# Patient Record
Sex: Female | Born: 1975 | Race: White | Hispanic: No | Marital: Married | Smoking: Never smoker
Health system: Southern US, Community
[De-identification: ages and names within clinical notes are randomized; demographics above are authoritative.]

## PROBLEM LIST (undated history)

## (undated) DIAGNOSIS — R112 Nausea with vomiting, unspecified: Secondary | ICD-10-CM

## (undated) DIAGNOSIS — R519 Headache, unspecified: Secondary | ICD-10-CM

## (undated) DIAGNOSIS — F439 Reaction to severe stress, unspecified: Secondary | ICD-10-CM

## (undated) DIAGNOSIS — R51 Headache: Secondary | ICD-10-CM

## (undated) DIAGNOSIS — Z9889 Other specified postprocedural states: Secondary | ICD-10-CM

## (undated) DIAGNOSIS — I341 Nonrheumatic mitral (valve) prolapse: Secondary | ICD-10-CM

## (undated) DIAGNOSIS — J386 Stenosis of larynx: Secondary | ICD-10-CM

## (undated) DIAGNOSIS — R195 Other fecal abnormalities: Secondary | ICD-10-CM

## (undated) DIAGNOSIS — N939 Abnormal uterine and vaginal bleeding, unspecified: Principal | ICD-10-CM

## (undated) HISTORY — PX: DILATION AND CURETTAGE OF UTERUS: SHX78

## (undated) HISTORY — PX: REDUCTION MAMMAPLASTY: SUR839

## (undated) HISTORY — DX: Reaction to severe stress, unspecified: F43.9

## (undated) HISTORY — DX: Other fecal abnormalities: R19.5

## (undated) HISTORY — DX: Abnormal uterine and vaginal bleeding, unspecified: N93.9

## (undated) HISTORY — PX: WISDOM TOOTH EXTRACTION: SHX21

---

## 2006-02-12 ENCOUNTER — Emergency Department (HOSPITAL_COMMUNITY): Admission: EM | Admit: 2006-02-12 | Discharge: 2006-02-12 | Payer: Self-pay | Admitting: Emergency Medicine

## 2006-02-15 ENCOUNTER — Encounter (INDEPENDENT_AMBULATORY_CARE_PROVIDER_SITE_OTHER): Payer: Self-pay | Admitting: Specialist

## 2006-02-15 ENCOUNTER — Ambulatory Visit (HOSPITAL_COMMUNITY): Admission: RE | Admit: 2006-02-15 | Discharge: 2006-02-15 | Payer: Self-pay | Admitting: General Surgery

## 2006-02-15 HISTORY — PX: CHOLECYSTECTOMY: SHX55

## 2007-07-18 ENCOUNTER — Other Ambulatory Visit: Admission: RE | Admit: 2007-07-18 | Discharge: 2007-07-18 | Payer: Self-pay | Admitting: Obstetrics and Gynecology

## 2008-08-16 ENCOUNTER — Other Ambulatory Visit: Admission: RE | Admit: 2008-08-16 | Discharge: 2008-08-16 | Payer: Self-pay | Admitting: Obstetrics and Gynecology

## 2008-11-13 ENCOUNTER — Ambulatory Visit (HOSPITAL_COMMUNITY): Admission: RE | Admit: 2008-11-13 | Discharge: 2008-11-13 | Payer: Self-pay | Admitting: Family Medicine

## 2009-02-19 HISTORY — PX: PLANTAR FASCIA RELEASE: SHX2239

## 2009-02-27 ENCOUNTER — Ambulatory Visit (HOSPITAL_COMMUNITY): Admission: RE | Admit: 2009-02-27 | Discharge: 2009-02-27 | Payer: Self-pay | Admitting: Podiatry

## 2009-02-27 HISTORY — PX: FOOT SURGERY: SHX648

## 2009-04-11 ENCOUNTER — Ambulatory Visit (HOSPITAL_COMMUNITY): Admission: RE | Admit: 2009-04-11 | Discharge: 2009-04-11 | Payer: Self-pay | Admitting: Orthopedic Surgery

## 2009-05-01 ENCOUNTER — Ambulatory Visit (HOSPITAL_BASED_OUTPATIENT_CLINIC_OR_DEPARTMENT_OTHER): Admission: RE | Admit: 2009-05-01 | Discharge: 2009-05-01 | Payer: Self-pay | Admitting: Orthopedic Surgery

## 2009-05-01 HISTORY — PX: GASTROCNEMIUS RECESSION: SHX863

## 2009-05-28 ENCOUNTER — Encounter (HOSPITAL_COMMUNITY): Admission: RE | Admit: 2009-05-28 | Discharge: 2009-06-27 | Payer: Self-pay | Admitting: Orthopedic Surgery

## 2009-12-24 ENCOUNTER — Ambulatory Visit (HOSPITAL_COMMUNITY)
Admission: RE | Admit: 2009-12-24 | Discharge: 2009-12-24 | Payer: Self-pay | Source: Home / Self Care | Admitting: Family Medicine

## 2010-02-09 ENCOUNTER — Encounter: Payer: Self-pay | Admitting: Orthopedic Surgery

## 2010-04-09 LAB — BASIC METABOLIC PANEL
BUN: 12 mg/dL (ref 6–23)
Chloride: 103 mEq/L (ref 96–112)
Glucose, Bld: 96 mg/dL (ref 70–99)
Potassium: 3.6 mEq/L (ref 3.5–5.1)
Sodium: 139 mEq/L (ref 135–145)

## 2010-04-09 LAB — CBC
HCT: 38.8 % (ref 36.0–46.0)
Hemoglobin: 13 g/dL (ref 12.0–15.0)
MCV: 92.1 fL (ref 78.0–100.0)
Platelets: 216 10*3/uL (ref 150–400)
WBC: 8.4 10*3/uL (ref 4.0–10.5)

## 2010-06-06 NOTE — Op Note (Signed)
Laurie Thompson, Laurie Thompson                ACCOUNT NO.:  [id number]   MEDICAL RECORD NO.:  [id number]          PATIENT TYPE:  AMB   LOCATION:  DAY                           FACILITY:  [facility]   PHYSICIAN:  Dalia Heading, M.D.  DATE OF BIRTH:  [birth year]   DATE OF PROCEDURE:  02/15/2006  DATE OF DISCHARGE:                               OPERATIVE REPORT   PREOPERATIVE DIAGNOSIS:  Chronic cholecystitis.   POSTOPERATIVE DIAGNOSIS:  Chronic cholecystitis.   PROCEDURE:  Laparoscopic cholecystectomy.   SURGEON:  Dr. Franky Macho.   ANESTHESIA:  General endotracheal.   INDICATIONS:  The patient is a 35 year old white female who presents  with biliary colic secondary to chronic cholecystitis.  The risks and  benefits of the procedure including bleeding, infection, hepatobiliary  injury, and the possibility of an open procedure were fully explained to  the patient, who gave informed consent.   PROCEDURE NOTE:  The patient was placed in the supine position.  After  induction of general endotracheal anesthesia, the abdomen was prepped  and draped using the usual sterile technique with Betadine.  Surgical  site confirmation was performed.   An infraumbilical incision was made down to the fascia.  A Veress needle  was introduced into the abdominal cavity and confirmation of placement  was done using the saline drop test.  The abdomen was then insufflated  to 16 mmHg pressure.  An 11 mm trocar was introduced into the abdominal  cavity under direct visualization without difficulty.  The patient was  placed in reversed Trendelenburg position.  An additional 11-mm trocar  was placed in the epigastric region and 5-mm trocars were placed in the  right upper quadrant, right flank regions.  The liver was inspected and  noted to be within normal limits.  The gallbladder was retracted  superior and laterally.  The dissection was begun around the  infundibulum of the gallbladder.  The cystic duct was first  identified.  The infundibulum was fully identified.  Endo clips were placed  proximally distally on the cystic duct and the cystic duct was divided.  This was likewise done to the cystic artery.   The gallbladder was then freed away from the gallbladder fossa using  Bovie electrocautery.  The gallbladder was delivered through the  epigastric trocar site using an EndoCatch bag.  The gallbladder fossa  was inspected and no abnormal bleeding or bile leakage was noted.  Surgicel was placed in the gallbladder fossa.  All fluid and air were  then evacuated from the abdominal cavity prior to removal of the  trocars.   All wounds were irrigated with normal saline.  All wounds were injected  with 0.5 cm Sensorcaine.  The infraumbilical fascia was reapproximated  using an O Vicryl interrupted suture.  All skin incisions were closed  using staples.  Betadine ointment and dry sterile dressings were  applied.   All tape and needle counts were correct at the end of the procedure.  The patient was extubated in the operating room and went back to the  recovery room awake and in stable condition.  COMPLICATIONS:  None.   SPECIMEN:  Gallbladder.   ESTIMATED BLOOD LOSS:  Minimal.      Dalia Heading, M.D.  Electronically Signed     MAJ/MEDQ  D:  02/15/2006  T:  02/15/2006  Job:  [id number]   cc:   Dalia Heading, M.D.  Fax: [phone]   Donzetta Sprung  Fax: [phone]

## 2010-09-01 ENCOUNTER — Other Ambulatory Visit: Payer: Self-pay | Admitting: Adult Health

## 2010-09-01 ENCOUNTER — Other Ambulatory Visit (HOSPITAL_COMMUNITY)
Admission: RE | Admit: 2010-09-01 | Discharge: 2010-09-01 | Disposition: A | Discharge status: Home / Self Care | Payer: 59 | Source: Ambulatory Visit | Attending: Obstetrics and Gynecology | Admitting: Obstetrics and Gynecology

## 2010-09-01 DIAGNOSIS — Z01419 Encounter for gynecological examination (general) (routine) without abnormal findings: Secondary | ICD-10-CM | POA: Insufficient documentation

## 2010-09-19 ENCOUNTER — Ambulatory Visit (HOSPITAL_COMMUNITY)
Admission: RE | Admit: 2010-09-19 | Discharge: 2010-09-19 | Disposition: A | Discharge status: Home / Self Care | Payer: 59 | Source: Ambulatory Visit | Attending: Family Medicine | Admitting: Family Medicine

## 2010-09-19 ENCOUNTER — Other Ambulatory Visit: Payer: Self-pay | Admitting: Family Medicine

## 2010-09-19 DIAGNOSIS — M542 Cervicalgia: Secondary | ICD-10-CM

## 2011-02-19 ENCOUNTER — Other Ambulatory Visit: Payer: Self-pay | Admitting: Obstetrics & Gynecology

## 2011-02-27 ENCOUNTER — Encounter (HOSPITAL_COMMUNITY): Payer: Self-pay

## 2011-02-27 ENCOUNTER — Emergency Department (HOSPITAL_COMMUNITY)
Admission: EM | Admit: 2011-02-27 | Discharge: 2011-02-27 | Disposition: A | Discharge status: Home / Self Care | Payer: 59 | Source: Home / Self Care | Attending: Emergency Medicine | Admitting: Emergency Medicine

## 2011-02-27 DIAGNOSIS — H8110 Benign paroxysmal vertigo, unspecified ear: Secondary | ICD-10-CM

## 2011-02-27 HISTORY — DX: Headache: R51

## 2011-02-27 HISTORY — DX: Nonrheumatic mitral (valve) prolapse: I34.1

## 2011-02-27 HISTORY — DX: Headache, unspecified: R51.9

## 2011-02-27 LAB — POCT URINALYSIS DIP (DEVICE)
Bilirubin Urine: NEGATIVE
Ketones, ur: NEGATIVE mg/dL
Protein, ur: NEGATIVE mg/dL
Specific Gravity, Urine: 1.02 (ref 1.005–1.030)
pH: 7 (ref 5.0–8.0)

## 2011-02-27 LAB — POCT I-STAT, CHEM 8
BUN: 12 mg/dL (ref 6–23)
Chloride: 105 mEq/L (ref 96–112)
Creatinine, Ser: 0.7 mg/dL (ref 0.50–1.10)
Glucose, Bld: 96 mg/dL (ref 70–99)
Hemoglobin: 13.9 g/dL (ref 12.0–15.0)
Potassium: 3.8 mEq/L (ref 3.5–5.1)
Sodium: 143 mEq/L (ref 135–145)

## 2011-02-27 MED ORDER — MECLIZINE HCL 25 MG PO TABS: 25.0000 mg | ORAL_TABLET | Freq: Four times a day (QID) | ORAL | Status: AC

## 2011-02-27 NOTE — ED Notes (Addendum)
Had one episode of dizziness last week , resolved w/o treatment. Today, had same type syx, and her CN, told her she looked pale. Pt c/o consist nat syx sine onset.no changes w position change or food intake or position change. LMP 2-08, husband had vasectomy , pt on a-jestin po at hs PAtietn denies n/v/d

## 2011-02-27 NOTE — ED Provider Notes (Signed)
History     CSN: [id number]  Arrival date & time 02/27/11  1310   First MD Initiated Contact with Patient 02/27/11 1401      Chief Complaint  Patient presents with  . Dizziness    (Consider location/radiation/quality/duration/timing/severity/associated sxs/prior treatment) HPI Comments: Patient with episodes of dizziness described as "room spinning around me" starting several days ago. Symptoms lasted minutes and then resolve. Patient is noted to have some nystagmus with symptomatic. Episodes seem to happen  with rapid change in head or body position. reports nausea, but no vomiting. No headaches, blurry vision, tinnitus, hearing loss, ear pain, sinus pain/pressure, nasal congestion, extremity weakness, dysarthria, facial droop. No palpitations, chest pain, shortness of breath when symptomatic. No recent URI symptoms, or ototoxic drugs. No similar symptoms previously. Patient is a Engineer, civil (consulting) at [employer], and was sent here by her supervisor for evaluation.     Patient is a 36 y.o. female presenting with neurologic complaint. The history is provided by the patient. No language interpreter was used.  Neurologic Problem The primary symptoms include dizziness and nausea. Primary symptoms do not include headaches, fever or vomiting.  Dizziness also occurs with nausea. Dizziness does not occur with tinnitus, vomiting or weakness.  Additional symptoms include vertigo. Additional symptoms do not include neck stiffness, weakness, pain, photophobia, aura, hyperacusis, hearing loss or tinnitus. Medical issues do not include cerebral vascular accident. Workup history includes MRI.    Past Medical History  Diagnosis Date  . Persistent headaches   . Mitral valve prolapse     Past Surgical History  Procedure Date  . Cholecystectomy   . Dilation and curettage of uterus     History reviewed. No pertinent family history.  History  Substance Use Topics  . Smoking status: Never Smoker   . Smokeless  tobacco: Not on file  . Alcohol Use: No    OB History    Grav Para Term Preterm Abortions TAB SAB Ect Mult Living                  Review of Systems  Constitutional: Negative for fever.  HENT: Negative for hearing loss, congestion, neck stiffness, sinus pressure and tinnitus.   Eyes: Negative for photophobia.  Respiratory: Negative for shortness of breath.   Cardiovascular: Negative for chest pain and palpitations.  Gastrointestinal: Positive for nausea. Negative for vomiting and abdominal pain.  Neurological: Positive for dizziness and vertigo. Negative for syncope, facial asymmetry, speech difficulty, weakness, light-headedness, numbness and headaches.    Allergies  Dilaudid and Erythromycin  Home Medications   Current Outpatient Rx  Name Route Sig Dispense Refill  . ESCITALOPRAM OXALATE 10 MG PO TABS Oral Take 10 mg by mouth daily.    Marland Kitchen MECLIZINE HCL 25 MG PO TABS Oral Take 1 tablet (25 mg total) by mouth 4 (four) times daily. 28 tablet 0    BP 130/86  Pulse 78  Temp(Src) 98.6 F (37 C) (Oral)  Resp 20  SpO2 100%  Filed Vitals:   02/27/11 1349 02/27/11 1443 02/27/11 1445 02/27/11 1446  BP: 129/73 122/76 125/62 130/86  Pulse: 80 80 67 78  Temp: 98.6 F (37 C)     TempSrc: Oral     Resp: 20     SpO2: 100%        Physical Exam  Nursing note and vitals reviewed. Constitutional: She is oriented to person, place, and time. She appears well-developed and well-nourished. No distress.  HENT:  Head: Normocephalic and atraumatic.  Right Ear:  Tympanic membrane normal.  Left Ear: Tympanic membrane normal.  Nose: Nose normal. Right sinus exhibits no maxillary sinus tenderness and no frontal sinus tenderness. Left sinus exhibits no maxillary sinus tenderness and no frontal sinus tenderness.  Mouth/Throat: Uvula is midline, oropharynx is clear and moist and mucous membranes are normal.  Eyes: Conjunctivae and EOM are normal. Pupils are equal, round, and reactive to  light.  Neck: Normal range of motion and full passive range of motion without pain. Neck supple. No spinous process tenderness and no muscular tenderness present. Carotid bruit is not present.  Cardiovascular: Normal rate, regular rhythm and normal heart sounds.   Pulmonary/Chest: Effort normal and breath sounds normal.  Abdominal: Soft. Bowel sounds are normal. She exhibits no distension.  Musculoskeletal: Normal range of motion.  Lymphadenopathy:    She has no cervical adenopathy.  Neurological: She is alert and oriented to person, place, and time. She has normal strength. No cranial nerve deficit or sensory deficit. Coordination and gait normal.       Positive Dix-Hallpike on left side. With fatigable nystagmus  Skin: Skin is warm and dry.  Psychiatric: She has a normal mood and affect. Her behavior is normal. Judgment and thought content normal.    ED Course  Procedures (including critical care time)  Labs Reviewed  POCT URINALYSIS DIP (DEVICE) - Abnormal; Notable for the following:    Hgb urine dipstick TRACE (*)    Leukocytes, UA TRACE (*) Biochemical Testing Only. Please order routine urinalysis from main lab if confirmatory testing is needed.   All other components within normal limits  POCT I-STAT, CHEM 8   No results found.   1. BPPV (benign paroxysmal positional vertigo)     Results for orders placed during the hospital encounter of 02/27/11  POCT I-STAT, CHEM 8      Component Value Range   Sodium 143  135 - 145 (mEq/L)   Potassium 3.8  3.5 - 5.1 (mEq/L)   Chloride 105  96 - 112 (mEq/L)   BUN 12  6 - 23 (mg/dL)   Creatinine, Ser 1.61  0.50 - 1.10 (mg/dL)   Glucose, Bld 96  70 - 99 (mg/dL)   Calcium, Ion 0.96  0.45 - 1.32 (mmol/L)   TCO2 28  0 - 100 (mmol/L)   Hemoglobin 13.9  12.0 - 15.0 (g/dL)   HCT 40.9  81.1 - 91.4 (%)  POCT URINALYSIS DIP (DEVICE)      Component Value Range   Glucose, UA NEGATIVE  NEGATIVE (mg/dL)   Bilirubin Urine NEGATIVE  NEGATIVE     Ketones, ur NEGATIVE  NEGATIVE (mg/dL)   Specific Gravity, Urine 1.020  1.005 - 1.030    Hgb urine dipstick TRACE (*) NEGATIVE    pH 7.0  5.0 - 8.0    Protein, ur NEGATIVE  NEGATIVE (mg/dL)   Urobilinogen, UA 1.0  0.0 - 1.0 (mg/dL)   Nitrite NEGATIVE  NEGATIVE    Leukocytes, UA TRACE (*) NEGATIVE      MDM  Previous records, labs, imaging reviewed. 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 Had a MRI done in 2011 because patient was having daily headaches for 5 months., MRI was normal.  Patient not orthostatic, electrolytes within normal limits. H&P most consistent with benign positional paroxysmal vertigo. Performed Epley maneuver, with symptomatic relief. Will send home with meclizine. Udip noted. Patient not having any urinary symptoms. Will not treat this. Discussed all labs, medical decision-making with patient, who agrees with plan.  Luiz Blare, MD 02/27/11 [phone]

## 2011-06-26 ENCOUNTER — Ambulatory Visit (HOSPITAL_BASED_OUTPATIENT_CLINIC_OR_DEPARTMENT_OTHER): Admit: 2011-06-26 | Payer: Self-pay | Admitting: Plastic Surgery

## 2011-06-26 ENCOUNTER — Encounter (HOSPITAL_BASED_OUTPATIENT_CLINIC_OR_DEPARTMENT_OTHER): Payer: Self-pay

## 2011-06-26 SURGERY — BREAST REDUCTION WITH LIPOSUCTION
Anesthesia: General | Site: Breast | Laterality: Bilateral

## 2011-09-29 ENCOUNTER — Encounter (HOSPITAL_BASED_OUTPATIENT_CLINIC_OR_DEPARTMENT_OTHER): Payer: Self-pay | Admitting: *Deleted

## 2011-09-30 ENCOUNTER — Encounter (HOSPITAL_BASED_OUTPATIENT_CLINIC_OR_DEPARTMENT_OTHER): Payer: Self-pay | Admitting: *Deleted

## 2011-10-06 ENCOUNTER — Encounter (HOSPITAL_BASED_OUTPATIENT_CLINIC_OR_DEPARTMENT_OTHER): Payer: Self-pay | Admitting: Anesthesiology

## 2011-10-06 ENCOUNTER — Encounter (HOSPITAL_BASED_OUTPATIENT_CLINIC_OR_DEPARTMENT_OTHER): Payer: Self-pay | Admitting: *Deleted

## 2011-10-06 ENCOUNTER — Ambulatory Visit (HOSPITAL_BASED_OUTPATIENT_CLINIC_OR_DEPARTMENT_OTHER)
Admission: RE | Admit: 2011-10-06 | Discharge: 2011-10-07 | Disposition: A | Discharge status: Home / Self Care | Payer: 59 | Source: Ambulatory Visit | Attending: Plastic Surgery | Admitting: Plastic Surgery

## 2011-10-06 ENCOUNTER — Encounter (HOSPITAL_BASED_OUTPATIENT_CLINIC_OR_DEPARTMENT_OTHER)
Admission: RE | Disposition: A | Discharge status: Home / Self Care | Payer: Self-pay | Source: Ambulatory Visit | Attending: Plastic Surgery

## 2011-10-06 ENCOUNTER — Ambulatory Visit (HOSPITAL_BASED_OUTPATIENT_CLINIC_OR_DEPARTMENT_OTHER): Payer: 59 | Admitting: Anesthesiology

## 2011-10-06 DIAGNOSIS — N62 Hypertrophy of breast: Secondary | ICD-10-CM | POA: Insufficient documentation

## 2011-10-06 HISTORY — PX: BREAST REDUCTION SURGERY: SHX8

## 2011-10-06 LAB — POCT HEMOGLOBIN-HEMACUE: Hemoglobin: 11.8 g/dL — ABNORMAL LOW (ref 12.0–15.0)

## 2011-10-06 SURGERY — MAMMOPLASTY, REDUCTION
Anesthesia: General | Site: Breast | Laterality: Bilateral | Wound class: Clean

## 2011-10-06 MED ORDER — MORPHINE SULFATE 10 MG/ML IJ SOLN
INTRAMUSCULAR | Status: DC | PRN
  Administered 2011-10-06: 2 mg via INTRAVENOUS
  Administered 2011-10-06: 4 mg via INTRAVENOUS
  Administered 2011-10-06 (×4): 2 mg via INTRAVENOUS

## 2011-10-06 MED ORDER — ESCITALOPRAM OXALATE 10 MG PO TABS: 10.0000 mg | ORAL_TABLET | Freq: Every day | ORAL | Status: DC

## 2011-10-06 MED ORDER — ROCURONIUM BROMIDE 100 MG/10ML IV SOLN
INTRAVENOUS | Status: DC | PRN
  Administered 2011-10-06: 50 mg via INTRAVENOUS
  Administered 2011-10-06: 10 mg via INTRAVENOUS
  Administered 2011-10-06: 20 mg via INTRAVENOUS

## 2011-10-06 MED ORDER — CEFAZOLIN SODIUM-DEXTROSE 2-3 GM-% IV SOLR
INTRAVENOUS | Status: DC | PRN
  Administered 2011-10-06: 2 g via INTRAVENOUS

## 2011-10-06 MED ORDER — CEFAZOLIN SODIUM 1-5 GM-% IV SOLN: 1.0000 g | Freq: Three times a day (TID) | INTRAVENOUS | Status: DC

## 2011-10-06 MED ORDER — OXYCODONE HCL 5 MG/5ML PO SOLN: 5.0000 mg | Freq: Once | ORAL | Status: AC | PRN

## 2011-10-06 MED ORDER — ACETAMINOPHEN 10 MG/ML IV SOLN
1000.0000 mg | Freq: Once | INTRAVENOUS | Status: AC
  Administered 2011-10-06: 1000 mg via INTRAVENOUS

## 2011-10-06 MED ORDER — ONDANSETRON HCL 4 MG/2ML IJ SOLN
4.0000 mg | Freq: Once | INTRAMUSCULAR | Status: AC | PRN
  Administered 2011-10-06: 4 mg via INTRAVENOUS

## 2011-10-06 MED ORDER — PROMETHAZINE HCL 25 MG/ML IJ SOLN
6.2500 mg | INTRAMUSCULAR | Status: DC | PRN
  Administered 2011-10-06: 6.25 mg via INTRAVENOUS

## 2011-10-06 MED ORDER — DEXTROSE-NACL 5-0.45 % IV SOLN
INTRAVENOUS | Status: DC
  Administered 2011-10-06 – 2011-10-07 (×2): via INTRAVENOUS

## 2011-10-06 MED ORDER — MIDAZOLAM HCL 5 MG/5ML IJ SOLN
INTRAMUSCULAR | Status: DC | PRN
  Administered 2011-10-06: 2 mg via INTRAVENOUS

## 2011-10-06 MED ORDER — 0.9 % SODIUM CHLORIDE (POUR BTL) OPTIME
TOPICAL | Status: DC | PRN
  Administered 2011-10-06: 1000 mL

## 2011-10-06 MED ORDER — GLYCOPYRROLATE 0.2 MG/ML IJ SOLN
INTRAMUSCULAR | Status: DC | PRN
  Administered 2011-10-06: .5 mg via INTRAVENOUS

## 2011-10-06 MED ORDER — LIDOCAINE HCL (CARDIAC) 20 MG/ML IV SOLN
INTRAVENOUS | Status: DC | PRN
  Administered 2011-10-06: 100 mg via INTRAVENOUS

## 2011-10-06 MED ORDER — MORPHINE SULFATE 2 MG/ML IJ SOLN: 2.0000 mg | INTRAMUSCULAR | Status: DC | PRN

## 2011-10-06 MED ORDER — OXYCODONE HCL 5 MG PO TABS: 10.0000 mg | ORAL_TABLET | ORAL | Status: DC | PRN

## 2011-10-06 MED ORDER — NEOSTIGMINE METHYLSULFATE 1 MG/ML IJ SOLN
INTRAMUSCULAR | Status: DC | PRN
  Administered 2011-10-06: 4 mg via INTRAVENOUS

## 2011-10-06 MED ORDER — OXYCODONE HCL 5 MG PO TABS: 5.0000 mg | ORAL_TABLET | Freq: Once | ORAL | Status: AC | PRN

## 2011-10-06 MED ORDER — METHOCARBAMOL 500 MG PO TABS
500.0000 mg | ORAL_TABLET | Freq: Four times a day (QID) | ORAL | Status: DC
  Administered 2011-10-06: 500 mg via ORAL

## 2011-10-06 MED ORDER — PROPOFOL 10 MG/ML IV BOLUS
INTRAVENOUS | Status: DC | PRN
  Administered 2011-10-06: 200 mg via INTRAVENOUS

## 2011-10-06 MED ORDER — ONDANSETRON HCL 4 MG/2ML IJ SOLN
INTRAMUSCULAR | Status: DC | PRN
  Administered 2011-10-06: 4 mg via INTRAVENOUS

## 2011-10-06 MED ORDER — DEXAMETHASONE SODIUM PHOSPHATE 4 MG/ML IJ SOLN
INTRAMUSCULAR | Status: DC | PRN
  Administered 2011-10-06: 10 mg via INTRAVENOUS

## 2011-10-06 MED ORDER — CEFAZOLIN SODIUM 1-5 GM-% IV SOLN
1.0000 g | Freq: Three times a day (TID) | INTRAVENOUS | Status: DC
  Administered 2011-10-06 – 2011-10-07 (×2): 1 g via INTRAVENOUS

## 2011-10-06 MED ORDER — LACTATED RINGERS IV SOLN
INTRAVENOUS | Status: DC
  Administered 2011-10-06 (×2): via INTRAVENOUS

## 2011-10-06 MED ORDER — FENTANYL CITRATE 0.05 MG/ML IJ SOLN
INTRAMUSCULAR | Status: DC | PRN
  Administered 2011-10-06: 100 ug via INTRAVENOUS

## 2011-10-06 MED ORDER — EPHEDRINE SULFATE 50 MG/ML IJ SOLN
INTRAMUSCULAR | Status: DC | PRN
  Administered 2011-10-06: 10 mg via INTRAVENOUS

## 2011-10-06 SURGICAL SUPPLY — 58 items
APPLICATOR CHLORAPREP 3ML ORNG (MISCELLANEOUS) ×4 IMPLANT
BANDAGE ELASTIC 6 VELCRO ST LF (GAUZE/BANDAGES/DRESSINGS) ×4 IMPLANT
BLADE HEX COATED 2.75 (ELECTRODE) ×2 IMPLANT
BLADE SURG 10 STRL SS (BLADE) ×4 IMPLANT
BLADE SURG 15 STRL LF DISP TIS (BLADE) ×1 IMPLANT
BLADE SURG 15 STRL SS (BLADE) ×1
CANISTER LINER 1300 C W/ELBOW (MISCELLANEOUS) IMPLANT
CANISTER SUCTION 1200CC (MISCELLANEOUS) ×2 IMPLANT
CLOTH BEACON ORANGE TIMEOUT ST (SAFETY) ×2 IMPLANT
COTTONBALL LRG STERILE PKG (GAUZE/BANDAGES/DRESSINGS) IMPLANT
COVER MAYO STAND STRL (DRAPES) ×2 IMPLANT
COVER SURGICAL LIGHT HANDLE (MISCELLANEOUS) ×2 IMPLANT
COVER TABLE BACK 60X90 (DRAPES) ×2 IMPLANT
DERMABOND ADVANCED (GAUZE/BANDAGES/DRESSINGS) ×3
DERMABOND ADVANCED .7 DNX12 (GAUZE/BANDAGES/DRESSINGS) ×3 IMPLANT
DRAIN CHANNEL 19F RND (DRAIN) IMPLANT
DRAPE LAPAROSCOPIC ABDOMINAL (DRAPES) ×2 IMPLANT
DRSG PAD ABDOMINAL 8X10 ST (GAUZE/BANDAGES/DRESSINGS) ×8 IMPLANT
ELECT REM PT RETURN 9FT ADLT (ELECTROSURGICAL) ×2
ELECTRODE REM PT RTRN 9FT ADLT (ELECTROSURGICAL) ×1 IMPLANT
EVACUATOR SILICONE 100CC (DRAIN) IMPLANT
FILTER 7/8 IN (FILTER) ×2 IMPLANT
FILTER LIPOSUCTION (MISCELLANEOUS) IMPLANT
GAUZE SPONGE 4X4 12PLY STRL LF (GAUZE/BANDAGES/DRESSINGS) ×2 IMPLANT
GAUZE XEROFORM 5X9 LF (GAUZE/BANDAGES/DRESSINGS) ×4 IMPLANT
GLOVE BIO SURGEON STRL SZ7.5 (GLOVE) ×2 IMPLANT
GLOVE BIO SURGEON STRL SZ8 (GLOVE) IMPLANT
GLOVE BIOGEL PI IND STRL 8 (GLOVE) ×1 IMPLANT
GLOVE BIOGEL PI INDICATOR 8 (GLOVE) ×1
GLOVE SKINSENSE NS SZ7.0 (GLOVE) ×1
GLOVE SKINSENSE STRL SZ7.0 (GLOVE) ×1 IMPLANT
GOWN PREVENTION PLUS XLARGE (GOWN DISPOSABLE) ×2 IMPLANT
GOWN PREVENTION PLUS XXLARGE (GOWN DISPOSABLE) ×2 IMPLANT
NEEDLE SPNL 18GX3.5 QUINCKE PK (NEEDLE) IMPLANT
NS IRRIG 1000ML POUR BTL (IV SOLUTION) ×2 IMPLANT
PACK BASIN DAY SURGERY FS (CUSTOM PROCEDURE TRAY) ×2 IMPLANT
PAD EYE OVAL STERILE LF (GAUZE/BANDAGES/DRESSINGS) IMPLANT
PIN SAFETY STERILE (MISCELLANEOUS) IMPLANT
SLEEVE SCD COMPRESS KNEE MED (MISCELLANEOUS) ×2 IMPLANT
SPECIMEN JAR X LARGE (MISCELLANEOUS) ×4 IMPLANT
SPONGE GAUZE 4X4 12PLY (GAUZE/BANDAGES/DRESSINGS) ×4 IMPLANT
SPONGE LAP 18X18 X RAY DECT (DISPOSABLE) ×8 IMPLANT
STAPLER VISISTAT 35W (STAPLE) ×2 IMPLANT
STRIP CLOSURE SKIN 1/2X4 (GAUZE/BANDAGES/DRESSINGS) IMPLANT
SUT MNCRL AB 3-0 PS2 18 (SUTURE) ×18 IMPLANT
SUT MNCRL AB 4-0 PS2 18 (SUTURE) ×4 IMPLANT
SUT VIC AB 2-0 PS2 27 (SUTURE) ×4 IMPLANT
SUT VICRYL 3-0 CR8 SH (SUTURE) ×2 IMPLANT
SYR 50ML LL SCALE MARK (SYRINGE) IMPLANT
SYR BULB IRRIGATION 50ML (SYRINGE) ×4 IMPLANT
TOWEL OR 17X24 6PK STRL BLUE (TOWEL DISPOSABLE) ×6 IMPLANT
TRAY FOLEY CATH 14FR (SET/KITS/TRAYS/PACK) IMPLANT
TUBE CONNECTING 20X1/4 (TUBING) ×2 IMPLANT
TUBING SET GRADUATE ASPIR 12FT (MISCELLANEOUS) IMPLANT
UNDERPAD 30X30 INCONTINENT (UNDERPADS AND DIAPERS) ×4 IMPLANT
VAC PENCILS W/TUBING CLEAR (MISCELLANEOUS) ×2 IMPLANT
WATER STERILE IRR 1000ML POUR (IV SOLUTION) ×2 IMPLANT
YANKAUER SUCT BULB TIP NO VENT (SUCTIONS) ×2 IMPLANT

## 2011-10-06 NOTE — Anesthesia Preprocedure Evaluation (Signed)
Anesthesia Evaluation  Patient identified by MRN, date of birth, ID band Patient awake    Reviewed: Allergy & Precautions, H&P , NPO status , Patient's Chart, lab work & pertinent test results  Airway Mallampati: I TM Distance: >3 FB Neck ROM: Full    Dental  (+) Teeth Intact and Dental Advisory Given   Pulmonary  breath sounds clear to auscultation        Cardiovascular Rhythm:Regular Rate:Normal     Neuro/Psych    GI/Hepatic   Endo/Other    Renal/GU      Musculoskeletal   Abdominal   Peds  Hematology   Anesthesia Other Findings   Reproductive/Obstetrics                           Anesthesia Physical Anesthesia Plan  ASA: I  Anesthesia Plan: General   Post-op Pain Management:    Induction: Intravenous  Airway Management Planned: Oral ETT  Additional Equipment:   Intra-op Plan:   Post-operative Plan: Extubation in OR  Informed Consent: I have reviewed the patients History and Physical, chart, labs and discussed the procedure including the risks, benefits and alternatives for the proposed anesthesia with the patient or authorized representative who has indicated his/her understanding and acceptance.   Dental advisory given  Plan Discussed with:   Anesthesia Plan Comments:         Anesthesia Quick Evaluation

## 2011-10-06 NOTE — H&P (Signed)
I have reexamined and reevaluated patient and there are no changes.  Heart: RRR without murmur. S1 and S2 WNL Lungs: Clear with equal breath sounds bilateral Breasts: No mass and no axillary adenopathy. Macromastia with ptosis.

## 2011-10-06 NOTE — Transfer of Care (Signed)
Immediate Anesthesia Transfer of Care Note  Patient: Laurie Thompson  Procedure(s) Performed: Procedure(s) (LRB) with comments: MAMMARY REDUCTION  (BREAST) (Bilateral)  Patient Location: PACU  Anesthesia Type: General  Level of Consciousness: sedated and responds to stimulation  Airway & Oxygen Therapy: Patient Spontanous Breathing and Patient connected to face mask oxygen  Post-op Assessment: Report given to PACU RN, Post -op Vital signs reviewed and stable and Patient moving all extremities  Post vital signs: Reviewed and stable  Complications: No apparent anesthesia complications

## 2011-10-06 NOTE — Anesthesia Postprocedure Evaluation (Signed)
  Anesthesia Post-op Note  Patient: Laurie Thompson  Procedure(s) Performed: Procedure(s) (LRB) with comments: MAMMARY REDUCTION  (BREAST) (Bilateral)  Patient Location: PACU  Anesthesia Type: General  Level of Consciousness: awake  Airway and Oxygen Therapy: Patient Spontanous Breathing and Patient connected to nasal cannula oxygen  Post-op Pain: none  Post-op Assessment: Post-op Vital signs reviewed, Patient's Cardiovascular Status Stable, Respiratory Function Stable, Patent Airway and No signs of Nausea or vomiting  Post-op Vital Signs: Reviewed and stable  Complications: No apparent anesthesia complications

## 2011-10-06 NOTE — Anesthesia Procedure Notes (Signed)
Procedure Name: Intubation Date/Time: 10/06/2011 12:44 PM Performed by: Caren Macadam Pre-anesthesia Checklist: Patient identified, Emergency Drugs available, Suction available and Patient being monitored Patient Re-evaluated:Patient Re-evaluated prior to inductionOxygen Delivery Method: Circle System Utilized Preoxygenation: Pre-oxygenation with 100% oxygen Intubation Type: IV induction Ventilation: Mask ventilation without difficulty Laryngoscope Size: Miller and 2 Grade View: Grade I Tube type: Oral Tube size: 7.0 mm Number of attempts: 1 Airway Equipment and Method: stylet and oral airway Placement Confirmation: ETT inserted through vocal cords under direct vision,  positive ETCO2 and breath sounds checked- equal and bilateral Secured at: 22 cm Tube secured with: Tape Dental Injury: Teeth and Oropharynx as per pre-operative assessment

## 2011-10-06 NOTE — Brief Op Note (Signed)
10/06/2011  4:14 PM  PATIENT:  Laurie Thompson  36 y.o. female  PRE-OPERATIVE DIAGNOSIS:  BREAST HYPERTROPHY  POST-OPERATIVE DIAGNOSIS:  BREAST HYPERTROPHY  PROCEDURE:  Procedure(s) (LRB) with comments: MAMMARY REDUCTION  (BREAST) (Bilateral)  SURGEON:  Surgeon(s) and Role:    * Etter Sjogren, MD - Primary  PHYSICIAN ASSISTANT:   ASSISTANTS: none   ANESTHESIA:   general  EBL:  Total I/O In: 1650 [I.V.:1650] Out: -   BLOOD ADMINISTERED:none  DRAINS: none   LOCAL MEDICATIONS USED:  NONE  SPECIMEN:  Source of Specimen:  Bilateral breast  DISPOSITION OF SPECIMEN:  PATHOLOGY  COUNTS:  YES  TOURNIQUET:  * No tourniquets in log *  DICTATION: .Other Dictation: Dictation Number [id number]  PLAN OF CARE: Admit for overnight observation  PATIENT DISPOSITION:  PACU - hemodynamically stable.   Delay start of Pharmacological VTE agent (>24hrs) due to surgical blood loss or risk of bleeding: yes

## 2011-10-07 ENCOUNTER — Encounter (HOSPITAL_BASED_OUTPATIENT_CLINIC_OR_DEPARTMENT_OTHER): Payer: Self-pay | Admitting: Plastic Surgery

## 2011-10-07 NOTE — Op Note (Signed)
NAMELUNDYN, COSTE NO.:  [id number]  MEDICAL RECORD NO.:  [id number]  LOCATION:                                 FACILITY:  PHYSICIAN:  Etter Sjogren, M.D.     DATE OF BIRTH:  Jun 30, 1975  DATE OF PROCEDURE:  10/06/2011 DATE OF DISCHARGE:                              OPERATIVE REPORT   PREOPERATIVE DIAGNOSIS:  Bilateral macromastia.  POSTOPERATIVE DIAGNOSIS:  Bilateral macromastia.  PROCEDURE PERFORMED:  Bilateral breast reduction.  SURGEON:  Etter Sjogren, M.D.  ANESTHESIA:  General.  ESTIMATED BLOOD LOSS:  250 mL.  CLINICAL NOTE:  A 36 year old woman complains of large breasts with upper back pain, neck pain, shoulder pain, and bra strap shoulder grooving.  She desire to go down to about half of her breast size from a triple D cup to a C cup.  Next this procedure and the risks as well as complications were discussed with her in great detail.  Risks include, but not limited to, bleeding, infection, anesthesia complications, healing problems, scarring, loss of sensation, fluid accumulations, loss of sensation in the nipple, loss of tissue including loss of nipple, loss of skin, loss of fatty tissue, asymmetry, disappointment, failure to relieve symptoms, pulmonary embolism, and she understood all of this and also inability to breast-feed and wished to proceed.  DESCRIPTION OF PROCEDURE:  The patient was marked in the holding area in standing position for breast reduction.  She did feel that her right breast was slightly smaller than the left.  She was taken to the operating room, placed supine.  After successful induction of general anesthesia, she was prepped with ChloraPrep and after waiting full 3 minutes for drying, she was draped with sterile drapes.  A 42-mm marker marked the new nipple-areolar complexes and 8 cm wide inferior nipple- areolar pedicles were designed.  Incisions made, inferior pedicles de- epithelialized, and then isolated from  surrounding tissues beveling in an outward direction medial and lateral in order to ensure broader attachment at the level of the chest wall then was present at the level of the skin.  Conclusion of this dissection, the nipple complexes were inspected, found to have excellent color and bright red bleeding around the periphery consistent with viability.  The resection was performed medial and central and lateral and a total of 690 g from the smaller right side and 725 g number larger left side resected.  Thorough irrigation with saline and meticulous hemostasis with electrocautery. Closure in layers with 3-0 Monocryl interrupted inverted deep sutures and a 2-0 Vicryl interrupted inverted deep dermal suture at the inverted T position followed by running 3-0 Monocryl subcuticular suture.  A measurement was then taken 5 cm up from the inframammary crease, and a 42 mm marker marked the site for the nipple-areolar complex.  This tissue resected, irrigated with saline, meticulous electrocautery and the nipple-areolar complex brought through this opening.  Again they were inspected and found to have excellent color and bright red bleeding around the periphery consistent with viability.  Nipple-areolar insetting with 3-0 Monocryl interrupted inverted deep dermal sutures and running 4-0 Monocryl subcuticular suture. Dermabond was then applied.  Dry sterile dressings and a circumferential  Ace wrap placed.  She was transferred to the recovery room stable having tolerated procedure well.  DISPOSITION:  She will be observed in the [facility] overnight.     Etter Sjogren, M.D.     DB/MEDQ  D:  10/06/2011  T:  10/07/2011  Job:  [id number]

## 2011-10-07 NOTE — Discharge Summary (Signed)
Physician Discharge Summary  Patient ID: Laurie Thompson MRN: [id number] DOB/AGE: 1975/12/04 36 y.o.  Admit date: 10/06/2011 Discharge date: 10/07/2011  Admission Diagnoses:Macromastia  Discharge Diagnoses: Same Active Problems:  * No active hospital problems. *    Discharged Condition: good  Hospital Course: On the day of admission the patient was taken to surgery and had bilateral breast reduction. The patient tolerated the procedures well. The patient was ambulatory and tolerating diet on the first postoperative day. Chest soft. No evidence of bleeding or infection..  Treatments: antibiotics: Ancef, anticoagulation: none and surgery: bilateral breast reduction.  Discharge Exam: Blood pressure 110/69, pulse 110, temperature 98 F (36.7 C), temperature source Oral, resp. rate 16, height 5\' 8"  (1.727 m), weight 196 lb 6 oz (89.075 kg), last menstrual period 08/28/2011, SpO2 99.00%.  Operative sites:Chest soft. No evidence of bleeding. Nipple complexes have good color. Good capillary refill 1-2 seconds. Sensate. Viable.  Disposition: 01-Home or Self Care     Medication List     As of 10/07/2011  7:49 AM    TAKE these medications         LEXAPRO 10 MG tablet   Generic drug: escitalopram   Take 10 mg by mouth daily.         SignedOdis Luster, Chanon Loney M 10/07/2011, 7:49 AM

## 2011-10-14 ENCOUNTER — Emergency Department (HOSPITAL_COMMUNITY): Payer: 59

## 2011-10-14 ENCOUNTER — Emergency Department (HOSPITAL_COMMUNITY)
Admission: EM | Admit: 2011-10-14 | Discharge: 2011-10-14 | Disposition: A | Discharge status: Home / Self Care | Payer: 59 | Attending: Emergency Medicine | Admitting: Emergency Medicine

## 2011-10-14 DIAGNOSIS — R51 Headache: Secondary | ICD-10-CM | POA: Insufficient documentation

## 2011-10-14 DIAGNOSIS — R202 Paresthesia of skin: Secondary | ICD-10-CM

## 2011-10-14 DIAGNOSIS — M6281 Muscle weakness (generalized): Secondary | ICD-10-CM | POA: Insufficient documentation

## 2011-10-14 DIAGNOSIS — R209 Unspecified disturbances of skin sensation: Secondary | ICD-10-CM | POA: Insufficient documentation

## 2011-10-14 LAB — CBC WITH DIFFERENTIAL/PLATELET
Basophils Absolute: 0 10*3/uL (ref 0.0–0.1)
Basophils Relative: 0 % (ref 0–1)
Eosinophils Absolute: 0.1 10*3/uL (ref 0.0–0.7)
Eosinophils Relative: 1 % (ref 0–5)
HCT: 36 % (ref 36.0–46.0)
Hemoglobin: 11.6 g/dL — ABNORMAL LOW (ref 12.0–15.0)
MCH: 29.9 pg (ref 26.0–34.0)
MCHC: 32.2 g/dL (ref 30.0–36.0)
MCV: 92.8 fL (ref 78.0–100.0)
Monocytes Absolute: 1 10*3/uL (ref 0.1–1.0)
Monocytes Relative: 9 % (ref 3–12)
RDW: 13 % (ref 11.5–15.5)

## 2011-10-14 LAB — COMPREHENSIVE METABOLIC PANEL
AST: 15 U/L (ref 0–37)
Albumin: 3.9 g/dL (ref 3.5–5.2)
BUN: 11 mg/dL (ref 6–23)
Calcium: 9.4 mg/dL (ref 8.4–10.5)
Chloride: 101 mEq/L (ref 96–112)
Creatinine, Ser: 0.69 mg/dL (ref 0.50–1.10)
Total Bilirubin: 0.4 mg/dL (ref 0.3–1.2)
Total Protein: 7 g/dL (ref 6.0–8.3)

## 2011-10-14 MED ORDER — ACETAMINOPHEN 325 MG PO TABS
650.0000 mg | ORAL_TABLET | Freq: Once | ORAL | Status: DC
  Filled 2011-10-14: qty 2

## 2011-10-14 NOTE — ED Notes (Signed)
MD at bedside. 

## 2011-10-14 NOTE — ED Notes (Signed)
Had breast reduction surgery 9/17 ( Dr Odis Luster) and noticed last night that rt hand wrist down went numb, went to dr today and was sent her for further tests

## 2011-10-14 NOTE — ED Provider Notes (Signed)
History     CSN: [id number]  Arrival date & time 10/14/11  1409   First MD Initiated Contact with Patient 10/14/11 1520      Chief Complaint  Patient presents with  . Weakness    (Consider location/radiation/quality/duration/timing/severity/associated sxs/prior treatment) The history is provided by the patient.  Laurie Thompson is a 36 y.o. female hx of headache, bilateral breast reduction on 9/17 here with R arm numbness. R arm numbness since around 9:30pm last night. She felt that her R wrist "fell asleep". She woke up this AM and had the same feeling. No weakness in R arm. No numbness or weakness in legs. She has hx of cervical disk disease and usually has L arm numbness but this time its R arm. No fever, chills, chest pain, SOB, or abdominal pain. She was sent in for r/o stroke.    Past Medical History  Diagnosis Date  . Persistent headaches   . Mitral valve prolapse     had echo 20 yrs ago no meds no prophylatic antibiotics    Past Surgical History  Procedure Date  . Dilation and curettage of uterus   . Cholecystectomy 02/15/2006    lap. chole.  . Gastrocnemius recession 05/01/2009    left gastroc. slide  . Foot surgery 02/27/2009    percutaneous microfasciotomy left foot  . Wisdom tooth extraction   . Plantar fascia release 02-2009  . Breast reduction surgery 10/06/2011    Procedure: MAMMARY REDUCTION  (BREAST);  Surgeon: Etter Sjogren, MD;  Location: [facility];  Service: Plastics;  Laterality: Bilateral;    No family history on file.  History  Substance Use Topics  . Smoking status: Never Smoker   . Smokeless tobacco: Not on file  . Alcohol Use: No    OB History    Grav Para Term Preterm Abortions TAB SAB Ect Mult Living                  Review of Systems  Neurological: Positive for numbness.  All other systems reviewed and are negative.    Allergies  Dilaudid and Erythromycin  Home Medications   Current Outpatient Rx  Name Route Sig  Dispense Refill  . CITALOPRAM HYDROBROMIDE 20 MG PO TABS Oral Take 20 mg by mouth at bedtime.      BP 117/68  Pulse 88  Temp 98.9 F (37.2 C)  Resp 16  SpO2 97%  Physical Exam  Nursing note and vitals reviewed. Constitutional: She is oriented to person, place, and time. She appears well-developed and well-nourished.       Comfortable   HENT:  Head: Normocephalic.  Mouth/Throat: Oropharynx is clear and moist.  Eyes: Conjunctivae normal are normal. Pupils are equal, round, and reactive to light.  Neck: Normal range of motion. Neck supple.  Cardiovascular: Normal rate, regular rhythm and intact distal pulses.   Pulmonary/Chest: Effort normal and breath sounds normal.  Abdominal: Soft. Bowel sounds are normal.  Musculoskeletal: Normal range of motion. She exhibits no edema and no tenderness.  Neurological: She is alert and oriented to person, place, and time.       Dec sensation with light touch on entire R upper extremity. Strength 5/5 on all extremities. 2+ pulses.   Skin: Skin is warm and dry.  Psychiatric: She has a normal mood and affect. Her behavior is normal. Judgment and thought content normal.    ED Course  Procedures (including critical care time)  Labs Reviewed  CBC WITH DIFFERENTIAL -  Abnormal; Notable for the following:    WBC 11.5 (*)     Hemoglobin 11.6 (*)     All other components within normal limits  COMPREHENSIVE METABOLIC PANEL - Abnormal; Notable for the following:    Potassium 3.4 (*)     Glucose, Bld 109 (*)     All other components within normal limits   Ct Head Wo Contrast  10/14/2011  *RADIOLOGY REPORT*  Clinical Data:  Headache, right hand numbness  CT HEAD WITHOUT CONTRAST CT CERVICAL SPINE WITHOUT CONTRAST  Technique:  Multidetector CT imaging of the head and cervical spine was performed following the standard protocol without intravenous contrast.  Multiplanar CT image reconstructions of the cervical spine were also generated.  Comparison:  [facility] MRI brain dated 12/24/2009  CT HEAD  Findings: No evidence of parenchymal hemorrhage or extra-axial fluid collection. No mass lesion, mass effect, or midline shift.  No CT evidence of acute infarction.  Cerebral volume is age appropriate.  No ventriculomegaly.  The visualized paranasal sinuses are essentially clear. The mastoid air cells are unopacified.  No evidence of calvarial fracture.  IMPRESSION: Normal head CT.  CT CERVICAL SPINE  Findings: Mild straightening of the cervical spine, positive positional.  No evidence of fracture or dislocation.  Vertebral body heights and intervertebral disc spaces are maintained.  The dens appears intact.  No prevertebral soft tissue swelling.  Visualized thyroid is unremarkable.  Visualized lung apices are clear.  IMPRESSION: Normal cervical spine CT.   Original Report Authenticated By: Charline Bills, M.D.    Ct Cervical Spine Wo Contrast  10/14/2011  *RADIOLOGY REPORT*  Clinical Data:  Headache, right hand numbness  CT HEAD WITHOUT CONTRAST CT CERVICAL SPINE WITHOUT CONTRAST  Technique:  Multidetector CT imaging of the head and cervical spine was performed following the standard protocol without intravenous contrast.  Multiplanar CT image reconstructions of the cervical spine were also generated.  Comparison:  [facility] MRI brain dated 12/24/2009  CT HEAD  Findings: No evidence of parenchymal hemorrhage or extra-axial fluid collection. No mass lesion, mass effect, or midline shift.  No CT evidence of acute infarction.  Cerebral volume is age appropriate.  No ventriculomegaly.  The visualized paranasal sinuses are essentially clear. The mastoid air cells are unopacified.  No evidence of calvarial fracture.  IMPRESSION: Normal head CT.  CT CERVICAL SPINE  Findings: Mild straightening of the cervical spine, positive positional.  No evidence of fracture or dislocation.  Vertebral body heights and intervertebral disc spaces are maintained.  The dens appears intact.   No prevertebral soft tissue swelling.  Visualized thyroid is unremarkable.  Visualized lung apices are clear.  IMPRESSION: Normal cervical spine CT.   Original Report Authenticated By: Charline Bills, M.D.      No diagnosis found.    MDM  GABREILLE DARDIS is a 36 y.o. female here with R arm paresthesias. I will consider possible cervical pinched nerve vs MS. Low possibility of stroke. Will get labs, CT head/neck. Will discuss case with neuro.   6:11 PM Patient's CBC, CMP unremarkable. CT head/neck unremarkable. I discussed case with neuro (Dr. Eilleen Kempf), who recommended outpatient neuro eval. I talked to the patient, who is ok with the plan. Return precautions given.        Richardean Canal, MD 10/14/11 [phone]

## 2011-10-14 NOTE — ED Notes (Signed)
Patient transported to CT 

## 2012-03-30 ENCOUNTER — Other Ambulatory Visit (HOSPITAL_COMMUNITY)
Admission: RE | Admit: 2012-03-30 | Discharge: 2012-03-30 | Disposition: A | Discharge status: Home / Self Care | Payer: 59 | Source: Ambulatory Visit | Attending: Obstetrics and Gynecology | Admitting: Obstetrics and Gynecology

## 2012-03-30 ENCOUNTER — Other Ambulatory Visit: Payer: Self-pay | Admitting: Adult Health

## 2012-03-30 DIAGNOSIS — Z1151 Encounter for screening for human papillomavirus (HPV): Secondary | ICD-10-CM | POA: Insufficient documentation

## 2012-03-30 DIAGNOSIS — N920 Excessive and frequent menstruation with regular cycle: Secondary | ICD-10-CM

## 2012-03-30 DIAGNOSIS — Z01419 Encounter for gynecological examination (general) (routine) without abnormal findings: Secondary | ICD-10-CM | POA: Insufficient documentation

## 2012-04-05 ENCOUNTER — Ambulatory Visit (HOSPITAL_COMMUNITY)
Admission: RE | Admit: 2012-04-05 | Discharge: 2012-04-05 | Disposition: A | Discharge status: Home / Self Care | Payer: 59 | Source: Ambulatory Visit | Attending: Adult Health | Admitting: Adult Health

## 2012-04-05 ENCOUNTER — Other Ambulatory Visit: Payer: Self-pay | Admitting: Adult Health

## 2012-04-05 DIAGNOSIS — R9389 Abnormal findings on diagnostic imaging of other specified body structures: Secondary | ICD-10-CM | POA: Insufficient documentation

## 2012-04-05 DIAGNOSIS — N920 Excessive and frequent menstruation with regular cycle: Secondary | ICD-10-CM

## 2012-04-07 ENCOUNTER — Telehealth: Payer: Self-pay | Admitting: Adult Health

## 2012-04-07 NOTE — Telephone Encounter (Signed)
Called pt left message on voice mail to return call in the morning.

## 2012-04-12 ENCOUNTER — Telehealth: Payer: Self-pay | Admitting: Adult Health

## 2012-04-14 NOTE — Telephone Encounter (Signed)
Left message on machine for patient to return call

## 2012-04-18 NOTE — Telephone Encounter (Signed)
Pt stated spoke with Cyril Mourning, NP last week. Will call office back to make appt. With Dr. Despina Hidden.

## 2012-04-25 ENCOUNTER — Ambulatory Visit (INDEPENDENT_AMBULATORY_CARE_PROVIDER_SITE_OTHER): Payer: 59 | Admitting: Obstetrics & Gynecology

## 2012-04-25 ENCOUNTER — Encounter: Payer: Self-pay | Admitting: Obstetrics & Gynecology

## 2012-04-25 VITALS — BP 130/80 | Ht 67.0 in | Wt 201.0 lb

## 2012-04-25 DIAGNOSIS — N920 Excessive and frequent menstruation with regular cycle: Secondary | ICD-10-CM

## 2012-04-25 MED ORDER — MEGESTROL ACETATE 40 MG PO TABS: 40.0000 mg | ORAL_TABLET | Freq: Every day | ORAL | Status: DC

## 2012-04-25 NOTE — Patient Instructions (Signed)

## 2012-04-25 NOTE — Progress Notes (Signed)
Patient ID: Laurie Thompson, female   DOB: [birth year], 37 y.o.   MRN: [id number]

## 2012-05-02 ENCOUNTER — Other Ambulatory Visit: Payer: 59

## 2012-05-02 DIAGNOSIS — R7309 Other abnormal glucose: Secondary | ICD-10-CM

## 2012-05-02 LAB — LIPID PANEL
Cholesterol: 174 mg/dL (ref 0–200)
LDL Cholesterol: 91 mg/dL (ref 0–99)
Triglycerides: 235 mg/dL — ABNORMAL HIGH (ref <150)
VLDL: 47 mg/dL — ABNORMAL HIGH (ref 0–40)

## 2012-05-17 ENCOUNTER — Telehealth: Payer: Self-pay | Admitting: *Deleted

## 2012-05-19 NOTE — Telephone Encounter (Signed)
Pt wants to delay surgery Stay on megace for cycle control

## 2012-05-27 NOTE — Telephone Encounter (Signed)
Pt informed to continue Megace as prescribed by Dr. Despina Hidden. Pt states  Only has a few tablets left, informed to have pharmacy to fax refill request. Pt verbalized understanding.

## 2012-06-02 ENCOUNTER — Telehealth: Payer: Self-pay | Admitting: Adult Health

## 2012-06-02 NOTE — Telephone Encounter (Signed)
Pt called back still bleeding on megace, try taking 2

## 2012-06-02 NOTE — Telephone Encounter (Signed)
Left message I called 

## 2012-07-26 ENCOUNTER — Other Ambulatory Visit: Payer: Self-pay | Admitting: *Deleted

## 2012-07-26 MED ORDER — MEGESTROL ACETATE 40 MG PO TABS: 40.0000 mg | ORAL_TABLET | Freq: Every day | ORAL | Status: DC

## 2012-12-22 ENCOUNTER — Other Ambulatory Visit: Payer: Self-pay | Admitting: Obstetrics & Gynecology

## 2013-02-16 ENCOUNTER — Other Ambulatory Visit (HOSPITAL_COMMUNITY): Payer: Self-pay | Admitting: Physician Assistant

## 2013-02-16 DIAGNOSIS — M79672 Pain in left foot: Secondary | ICD-10-CM

## 2013-02-17 ENCOUNTER — Other Ambulatory Visit (HOSPITAL_COMMUNITY): Payer: Self-pay | Admitting: Family Medicine

## 2013-02-17 DIAGNOSIS — M79672 Pain in left foot: Secondary | ICD-10-CM

## 2013-02-20 ENCOUNTER — Encounter (HOSPITAL_COMMUNITY): Payer: 59

## 2013-02-22 ENCOUNTER — Encounter (HOSPITAL_COMMUNITY)
Admission: RE | Admit: 2013-02-22 | Discharge: 2013-02-22 | Disposition: A | Discharge status: Home / Self Care | Payer: 59 | Source: Ambulatory Visit | Attending: Family Medicine | Admitting: Family Medicine

## 2013-02-22 DIAGNOSIS — M79609 Pain in unspecified limb: Secondary | ICD-10-CM | POA: Insufficient documentation

## 2013-02-22 DIAGNOSIS — M79672 Pain in left foot: Secondary | ICD-10-CM

## 2013-02-22 MED ORDER — TECHNETIUM TC 99M MEDRONATE IV KIT
25.0000 | PACK | Freq: Once | INTRAVENOUS | Status: AC | PRN
  Administered 2013-02-22: 25 via INTRAVENOUS

## 2013-05-04 ENCOUNTER — Other Ambulatory Visit: Payer: Self-pay | Admitting: Adult Health

## 2013-05-05 ENCOUNTER — Other Ambulatory Visit: Payer: 59 | Admitting: Adult Health

## 2013-05-18 ENCOUNTER — Telehealth: Payer: Self-pay | Admitting: Adult Health

## 2013-05-18 NOTE — Telephone Encounter (Signed)
Pt states started having vaginal bleeding on 04/20/2013 until now, have been taking the Megace daily and bleeding has not resolved. Appt made with Derrek Monaco, NP for Monday, April 4,2015.

## 2013-05-22 ENCOUNTER — Encounter: Payer: Self-pay | Admitting: Adult Health

## 2013-05-22 ENCOUNTER — Telehealth: Payer: Self-pay | Admitting: Adult Health

## 2013-05-22 ENCOUNTER — Ambulatory Visit (INDEPENDENT_AMBULATORY_CARE_PROVIDER_SITE_OTHER): Payer: 59 | Admitting: Adult Health

## 2013-05-22 VITALS — BP 130/90 | Ht 68.0 in | Wt 192.0 lb

## 2013-05-22 DIAGNOSIS — N926 Irregular menstruation, unspecified: Secondary | ICD-10-CM

## 2013-05-22 DIAGNOSIS — N939 Abnormal uterine and vaginal bleeding, unspecified: Secondary | ICD-10-CM

## 2013-05-22 HISTORY — DX: Abnormal uterine and vaginal bleeding, unspecified: N93.9

## 2013-05-22 LAB — CBC
HCT: 39.9 % (ref 36.0–46.0)
Hemoglobin: 13.3 g/dL (ref 12.0–15.0)
MCH: 29.8 pg (ref 26.0–34.0)
MCHC: 33.3 g/dL (ref 30.0–36.0)
MCV: 89.5 fL (ref 78.0–100.0)
PLATELETS: 256 10*3/uL (ref 150–400)
RBC: 4.46 MIL/uL (ref 3.87–5.11)
RDW: 13.5 % (ref 11.5–15.5)
WBC: 8.8 10*3/uL (ref 4.0–10.5)

## 2013-05-22 LAB — TSH: TSH: 1.829 u[IU]/mL (ref 0.350–4.500)

## 2013-05-22 NOTE — Patient Instructions (Signed)

## 2013-05-22 NOTE — Telephone Encounter (Signed)
Pt wanted to know if she could change her [place] appointment to Monday, the only appointment we have for Monday is at 2:00. Pt was told that if she was 5 minutes late that she would not be able to be seen. Pt just wants to make sure that if she is a few minutes late that she can still be seen.

## 2013-05-22 NOTE — Progress Notes (Signed)
Subjective:     Patient ID: Laurie Thompson, female   DOB: 24-Oct-1975, 38 y.o.   MRN: [id number]  HPI Laurie Thompson is a 38 year old white female in complaining of bleeding since 04/20/13 even while taking megace.Has been on megace for over a year and has done well, has started using [employer] an has lost 18 lbs.She stopped taking the megace 05/19/13.The bleeding has been heavy at times with clots.  Review of Systems See HPI Reviewed past medical,surgical, social and family history. Reviewed medications and allergies.     Objective:   Physical Exam BP 130/90  Ht 5\' 8"  (1.727 m)  Wt 192 lb (87.091 kg)  BMI 29.20 kg/m2  LMP 04/20/2013   Declined exam,talk only,see HPI.Will get labs and US,discussed getting ablation.  Assessment:     AUB    Plan:    Review handout on endometrial ablation Return 5/13 for US,will talk after [place] Check CBC and TSH

## 2013-05-22 NOTE — Telephone Encounter (Signed)
I spoke with Laurie Thompson and she it would be fine, Pt stated that her director is going to let her leave at 1:15. Pt should be here on time. Pt aware that appointment has been changed. [place] office to change appointment.

## 2013-05-24 ENCOUNTER — Telehealth: Payer: Self-pay | Admitting: Adult Health

## 2013-05-24 NOTE — Telephone Encounter (Signed)
Left message labs normal

## 2013-05-29 ENCOUNTER — Telehealth: Payer: Self-pay | Admitting: Adult Health

## 2013-05-29 ENCOUNTER — Ambulatory Visit (INDEPENDENT_AMBULATORY_CARE_PROVIDER_SITE_OTHER): Payer: 59

## 2013-05-29 DIAGNOSIS — N926 Irregular menstruation, unspecified: Secondary | ICD-10-CM

## 2013-05-29 DIAGNOSIS — N939 Abnormal uterine and vaginal bleeding, unspecified: Secondary | ICD-10-CM

## 2013-05-29 NOTE — Telephone Encounter (Signed)
Left message to call in am  

## 2013-05-30 ENCOUNTER — Telehealth: Payer: Self-pay | Admitting: Adult Health

## 2013-05-30 NOTE — Telephone Encounter (Signed)
Pt aware of [place] make appt with Dr Elonda Husky to discuss Hysto.D&C and ablation

## 2013-05-31 ENCOUNTER — Other Ambulatory Visit: Payer: 59

## 2013-06-02 ENCOUNTER — Encounter (HOSPITAL_COMMUNITY): Payer: Self-pay

## 2013-06-02 ENCOUNTER — Other Ambulatory Visit: Payer: Self-pay

## 2013-06-02 ENCOUNTER — Ambulatory Visit: Payer: 59 | Admitting: Obstetrics & Gynecology

## 2013-06-02 ENCOUNTER — Encounter (HOSPITAL_COMMUNITY)
Admission: RE | Admit: 2013-06-02 | Discharge: 2013-06-02 | Disposition: A | Discharge status: Home / Self Care | Payer: 59 | Source: Ambulatory Visit | Attending: Obstetrics & Gynecology | Admitting: Obstetrics & Gynecology

## 2013-06-02 ENCOUNTER — Encounter: Payer: Self-pay | Admitting: Obstetrics & Gynecology

## 2013-06-02 ENCOUNTER — Ambulatory Visit (INDEPENDENT_AMBULATORY_CARE_PROVIDER_SITE_OTHER): Payer: 59 | Admitting: Obstetrics & Gynecology

## 2013-06-02 VITALS — BP 130/80 | Ht 68.0 in | Wt 190.0 lb

## 2013-06-02 DIAGNOSIS — Z01812 Encounter for preprocedural laboratory examination: Secondary | ICD-10-CM | POA: Insufficient documentation

## 2013-06-02 DIAGNOSIS — N926 Irregular menstruation, unspecified: Secondary | ICD-10-CM

## 2013-06-02 DIAGNOSIS — N939 Abnormal uterine and vaginal bleeding, unspecified: Secondary | ICD-10-CM

## 2013-06-02 DIAGNOSIS — D250 Submucous leiomyoma of uterus: Secondary | ICD-10-CM | POA: Insufficient documentation

## 2013-06-02 HISTORY — DX: Nausea with vomiting, unspecified: R11.2

## 2013-06-02 HISTORY — DX: Other specified postprocedural states: Z98.890

## 2013-06-02 LAB — COMPREHENSIVE METABOLIC PANEL
ALBUMIN: 4.5 g/dL (ref 3.5–5.2)
ALT: 30 U/L (ref 0–35)
AST: 23 U/L (ref 0–37)
Alkaline Phosphatase: 90 U/L (ref 39–117)
BUN: 17 mg/dL (ref 6–23)
CO2: 25 mEq/L (ref 19–32)
CREATININE: 0.8 mg/dL (ref 0.50–1.10)
Calcium: 9.8 mg/dL (ref 8.4–10.5)
Chloride: 101 mEq/L (ref 96–112)
GFR calc Af Amer: >90 mL/min (ref >90)
GFR calc non Af Amer: >90 mL/min (ref >90)
Glucose, Bld: 90 mg/dL (ref 70–99)
POTASSIUM: 4.2 meq/L (ref 3.7–5.3)
Sodium: 141 mEq/L (ref 137–147)
TOTAL PROTEIN: 7.3 g/dL (ref 6.0–8.3)
Total Bilirubin: 0.5 mg/dL (ref 0.3–1.2)

## 2013-06-02 LAB — URINALYSIS, ROUTINE W REFLEX MICROSCOPIC
Bilirubin Urine: NEGATIVE
Glucose, UA: 100 mg/dL — AB
Ketones, ur: 15 mg/dL — AB
NITRITE: POSITIVE — AB
PH: 5 (ref 5.0–8.0)
Protein, ur: >300 mg/dL — AB
Urobilinogen, UA: 4 mg/dL — ABNORMAL HIGH (ref 0.0–1.0)

## 2013-06-02 LAB — HCG, QUANTITATIVE, PREGNANCY

## 2013-06-02 LAB — URINE MICROSCOPIC-ADD ON

## 2013-06-02 MED ORDER — KETOROLAC TROMETHAMINE 10 MG PO TABS: 10.0000 mg | ORAL_TABLET | Freq: Three times a day (TID) | ORAL | Status: DC | PRN

## 2013-06-02 MED ORDER — ONDANSETRON HCL 8 MG PO TABS: 8.0000 mg | ORAL_TABLET | Freq: Three times a day (TID) | ORAL | Status: DC | PRN

## 2013-06-02 MED ORDER — OXYCODONE-ACETAMINOPHEN 5-325 MG PO TABS: 1.0000 | ORAL_TABLET | ORAL | Status: DC | PRN

## 2013-06-02 NOTE — Progress Notes (Signed)
Patient ID: Laurie Thompson, female   DOB: 09-19-1975, 38 y.o.   MRN: [id number] GYNECOLOGIC SONOGRAM  [name] is a 38 y.o. [id number] LMP-04/20/2013 for a pelvic sonogram for Abnormal Uterine Bleeding since 04/20/2013.  Uterus 8.0 x 5.6 x 4.4 cm, retroverted uterus noted no myometrial masses noted  Endometrium 20.2 mm, asymmetrical, with ?clot vs ?mass noted within cavity  Right ovary 3.3 x 2.5 x 2.0 cm,  Left ovary 3.5 x 2.2 x 1.7 cm,  No free fluid or adnexal masses noted within pelvis  Technician Comments:  Retroverted uterus noted with asymmetrical thickened endometrium=20.75mm, bilateral adnexa/ovaries appears WNL, no free fluid or adnexal masses noted within pelvis  Laurie Thompson  05/29/2013  2:32 PM  Clinical Impression and recommendations:  I have reviewed the sonogram results above, combined with the patient's current clinical course, below are my impressions and any appropriate recommendations for management based on the sonographic findings.  Endometrial mass, fibroid vs polyp  Otherwise normal sonogram  Clinically recommend hysteroscopy with removal of mass and endometrial curettage, ablation if pt requests  Laurie Thompson  05/31/2013  8:25 AM  Been on megestrol x 13 months, was amenorrhec for 12 months but then started bleeding since 04/20/2013 Heavy Clots Cramping on right Sonogram above  Past Medical History  Diagnosis Date  . Persistent headaches   . Mitral valve prolapse     had echo 20 yrs ago no meds no prophylatic antibiotics  . Abnormal uterine bleeding (AUB) 05/22/2013    Past Surgical History  Procedure Laterality Date  . Dilation and curettage of uterus    . Cholecystectomy  02/15/2006    lap. chole.  . Gastrocnemius recession  05/01/2009    left gastroc. slide  . Foot surgery  02/27/2009    percutaneous microfasciotomy left foot  . Wisdom tooth extraction    . Plantar fascia release  02-2009  . Breast reduction surgery  10/06/2011    Procedure: MAMMARY  REDUCTION  (BREAST);  Surgeon: Crissie Reese, MD;  Location: [facility];  Service: Plastics;  Laterality: Bilateral;    OB History   Grav Para Term Preterm Abortions TAB SAB Ect Mult Living   4 3   1  1   3       Allergies  Allergen Reactions  . Dilaudid [Hydromorphone Hcl]     Throat sweling  . Erythromycin     Severe GI side effects  . Hydrocodeine [Dihydrocodeine] Itching    History   Social History  . Marital Status: Married    Spouse Name: N/A    Number of Children: N/A  . Years of Education: N/A   Social History Main Topics  . Smoking status: Never Smoker   . Smokeless tobacco: Never Used  . Alcohol Use: No  . Drug Use: No  . Sexual Activity: Yes    Birth Control/ Protection: Other-see comments     Comment: husband had vasectomy   Other Topics Concern  . None   Social History Narrative  . None    Family History  Problem Relation Age of Onset  . Hyperlipidemia Mother   . Macular degeneration Mother   . Cancer Father     lung cancer  . COPD Father   . Lung disease Father   . Heart disease Maternal Grandmother   . Lung disease Maternal Grandmother   . Hypertension Sister   . Fibromyalgia Sister   . GER disease Brother   . Cancer Paternal Aunt  cervical or ovarian   . Heart disease Maternal Grandfather     Review of Systems  Constitutional: Negative for fever, chills, weight loss, malaise/fatigue and diaphoresis.  HENT: Negative for hearing loss, ear pain, nosebleeds, congestion, sore throat, neck pain, tinnitus and ear discharge.   Eyes: Negative for blurred vision, double vision, photophobia, pain, discharge and redness.  Respiratory: Negative for cough, hemoptysis, sputum production, shortness of breath, wheezing and stridor.   Cardiovascular: Negative for chest pain, palpitations, orthopnea, claudication, leg swelling and PND.  Gastrointestinal: Positive for abdominal pain. Negative for heartburn, nausea, vomiting, diarrhea,  constipation, blood in stool and melena.  Genitourinary: Negative for dysuria, urgency, frequency, hematuria and flank pain.  Musculoskeletal: Negative for myalgias, back pain, joint pain and falls.  Skin: Negative for itching and rash.  Neurological: Negative for dizziness, tingling, tremors, sensory change, speech change, focal weakness, seizures, loss of consciousness, weakness and headaches.  Endo/Heme/Allergies: Negative for environmental allergies and polydipsia. Does not bruise/bleed easily.  Psychiatric/Behavioral: Negative for depression, suicidal ideas, hallucinations, memory loss and substance abuse. The patient is not nervous/anxious and does not have insomnia.      Impression Endometrial mass,favor  fibroid over polyp Menometrorrhagia  Recommend  Hysteroscopy uterine curettage endometrial ablation

## 2013-06-02 NOTE — Patient Instructions (Signed)
Laurie Thompson  06/02/2013   Your procedure is scheduled on:  06/07/13  Report to [facility] at 08:50 AM.  Call this number if you have problems the morning of surgery: [phone]   Remember:   Do not eat food or drink liquids after midnight.   Take these medicines the morning of surgery with A SIP OF WATER: Citalopram. You may take your Ketorolac, Zofran and Percocet if needed.   Do not wear jewelry, make-up or nail polish.  Do not wear lotions, powders, or perfumes. You may wear deodorant.  Do not shave 48 hours prior to surgery. Men may shave face and neck.  Do not bring valuables to the hospital.  [facility] is not responsible for any belongings or valuables.               Contacts, dentures or bridgework may not be worn into surgery.  Leave suitcase in the car. After surgery it may be brought to your room.  For patients admitted to the hospital, discharge time is determined by your treatment team.               Patients discharged the day of surgery will not be allowed to drive home.    Special Instructions: Shower using CHG 1 night before surgery and the morning of surgery. Use special wash - you have one bottle of CHG for both showers.  You should use approximately 1/2 of the bottle for each shower.   Please read over the following fact sheets that you were given: Anesthesia Post-op Instructions and Care and Recovery After Surgery    Hysteroscopy Hysteroscopy is a procedure used for looking inside the womb (uterus). It may be done for various reasons, including:  To evaluate abnormal bleeding, fibroid (benign, noncancerous) tumors, polyps, scar tissue (adhesions), and possibly cancer of the uterus.  To look for lumps (tumors) and other uterine growths.  To look for causes of why a woman cannot get pregnant (infertility), causes of recurrent loss of pregnancy (miscarriages), or a lost intrauterine device (IUD).  To perform a sterilization by blocking the fallopian tubes  from inside the uterus. In this procedure, a thin, flexible tube with a tiny light and camera on the end of it (hysteroscope) is used to look inside the uterus. A hysteroscopy should be done right after a menstrual period to be sure you are not pregnant. LET [facility] CARE PROVIDER KNOW ABOUT:   Any allergies you have.  All medicines you are taking, including vitamins, herbs, eye drops, creams, and over-the-counter medicines.  Previous problems you or members of your family have had with the use of anesthetics.  Any blood disorders you have.  Previous surgeries you have had.  Medical conditions you have. RISKS AND COMPLICATIONS  Generally, this is a safe procedure. However, as with any procedure, complications can occur. Possible complications include:  Putting a hole in the uterus.  Excessive bleeding.  Infection.  Damage to the cervix.  Injury to other organs.  Allergic reaction to medicines.  Too much fluid used in the uterus for the procedure. BEFORE THE PROCEDURE   Ask your health care provider about changing or stopping any regular medicines.  Do not take aspirin or blood thinners for 1 week before the procedure, or as directed by your health care provider. These can cause bleeding.  If you smoke, do not smoke for 2 weeks before the procedure.  In some cases, a medicine is placed in the cervix the day  before the procedure. This medicine makes the cervix have a larger opening (dilate). This makes it easier for the instrument to be inserted into the uterus during the procedure.  Do not eat or drink anything for at least 8 hours before the surgery.  Arrange for someone to take you home after the procedure. PROCEDURE   You may be given a medicine to relax you (sedative). You may also be given one of the following:  A medicine that numbs the area around the cervix (local anesthetic).  A medicine that makes you sleep through the procedure (general  anesthetic).  The hysteroscope is inserted through the vagina into the uterus. The camera on the hysteroscope sends a picture to a TV screen. This gives the surgeon a good view inside the uterus.  During the procedure, air or a liquid is put into the uterus, which allows the surgeon to see better.  Sometimes, tissue is gently scraped from inside the uterus. These tissue samples are sent to a lab for testing. AFTER THE PROCEDURE   If you had a general anesthetic, you may be groggy for a couple hours after the procedure.  If you had a local anesthetic, you will be able to go home as soon as you are stable and feel ready.  You may have some cramping. This normally lasts for a couple days.  You may have bleeding, which varies from light spotting for a few days to menstrual-like bleeding for 3 7 days. This is normal.  If your test results are not back during the visit, make an appointment with your health care provider to find out the results. Document Released: 04/13/2000 Document Revised: 10/26/2012 Document Reviewed: 08/04/2012 [facility] Patient Information 2014 [place], [place].    Dilation and Curettage or Vacuum Curettage Dilation and curettage (D&C) and vacuum curettage are minor procedures. A D&C involves stretching (dilation) the cervix and scraping (curettage) the inside lining of the womb (uterus). During a D&C, tissue is gently scraped from the inside lining of the uterus. During a vacuum curettage, the lining and tissue in the uterus are removed with the use of gentle suction.  Curettage may be performed to either diagnose or treat a problem. As a diagnostic procedure, curettage is performed to examine tissues from the uterus. A diagnostic curettage may be performed for the following symptoms:   Irregular bleeding in the uterus.   Bleeding with the development of clots.   Spotting between menstrual periods.   Prolonged menstrual periods.   Bleeding after menopause.    No menstrual period (amenorrhea).   A change in size and shape of the uterus.  As a treatment procedure, curettage may be performed for the following reasons:   Removal of an IUD (intrauterine device).   Removal of retained placenta after giving birth. Retained placenta can cause an infection or bleeding severe enough to require transfusions.   Abortion.   Miscarriage.   Removal of polyps inside the uterus.   Removal of uncommon types of noncancerous lumps (fibroids).  LET [facility] CARE PROVIDER KNOW ABOUT:   Any allergies you have.   All medicines you are taking, including vitamins, herbs, eye drops, creams, and over-the-counter medicines.   Previous problems you or members of your family have had with the use of anesthetics.   Any blood disorders you have.   Previous surgeries you have had.   Medical conditions you have. RISKS AND COMPLICATIONS  Generally, this is a safe procedure. However, as with any procedure, complications can  occur. Possible complications include:  Excessive bleeding.   Infection of the uterus.   Damage to the cervix.   Development of scar tissue (adhesions) inside the uterus, later causing abnormal amounts of menstrual bleeding.   Complications from the general anesthetic, if a general anesthetic is used.   Putting a hole (perforation) in the uterus. This is rare.  BEFORE THE PROCEDURE   Eat and drink before the procedure only as directed by your health care provider.   Arrange for someone to take you home.  PROCEDURE  This procedure usually takes about 15 30 minutes.  You will be given one of the following:  A medicine that numbs the area in and around the cervix (local anesthetic).   A medicine to make you sleep through the procedure (general anesthetic).  You will lie on your back with your legs in stirrups.   A warm metal or plastic instrument (speculum) will be placed in your vagina to keep it open  and to allow the health care provider to see the cervix.  There are two ways in which your cervix can be softened and dilated. These include:   Taking a medicine.   Having thin rods (laminaria) inserted into your cervix.   A curved tool (curette) will be used to scrape cells from the inside lining of the uterus. In some cases, gentle suction is applied with the curette. The curette will then be removed.  AFTER THE PROCEDURE   You will rest in the recovery area until you are stable and are ready to go home.   You may feel sick to your stomach (nauseous) or throw up (vomit) if you were given a general anesthetic.   You may have a sore throat if a tube was placed in your throat during general anesthesia.   You may have light cramping and bleeding. This may last for 2 days to 2 weeks after the procedure.   Your uterus needs to make a new lining after the procedure. This may make your next period late. Document Released: 01/05/2005 Document Revised: 09/07/2012 Document Reviewed: 08/04/2012 [facility] Patient Information 2014 [place], [place].    Endometrial Ablation Endometrial ablation removes the lining of the uterus (endometrium). It is usually a same-day, outpatient treatment. Ablation helps avoid major surgery, such as surgery to remove the cervix and uterus (hysterectomy). After endometrial ablation, you will have little or no menstrual bleeding and may not be able to have children. However, if you are premenopausal, you will need to use a reliable method of birth control following the procedure because of the small chance that pregnancy can occur. There are different reasons to have this procedure, which include:  Heavy periods.  Bleeding that is causing anemia.  Irregular bleeding.  Bleeding fibroids on the lining inside the uterus if they are smaller than 3 centimeters. This procedure should not be done if:  You want children in the future.  You have severe cramps  with your menstrual period.  You have precancerous or cancerous cells in your uterus.  You were recently pregnant.  You have gone through menopause.  You have had major surgery on the uterus, such as a cesarean delivery. LET [facility] CARE PROVIDER KNOW ABOUT:  Any allergies you have.  All medicines you are taking, including vitamins, herbs, eye drops, creams, and over-the-counter medicines.  Previous problems you or members of your family have had with the use of anesthetics.  Any blood disorders you have.  Previous surgeries you have had.  Medical conditions you have. RISKS AND COMPLICATIONS  Generally, this is a safe procedure. However, as with any procedure, complications can occur. Possible complications include:  Perforation of the uterus.  Bleeding.  Infection of the uterus, bladder, or vagina.  Injury to surrounding organs.  An air bubble to the lung (air embolus).  Pregnancy following the procedure.  Failure of the procedure to help the problem, requiring hysterectomy.  Decreased ability to diagnose cancer in the lining of the uterus. BEFORE THE PROCEDURE  The lining of the uterus must be tested to make sure there is no pre-cancerous or cancer cells present.  An ultrasound may be performed to look at the size of the uterus and to check for abnormalities.  Medicines may be given to thin the lining of the uterus. PROCEDURE  During the procedure, your health care provider will use a tool called a resectoscope to help see inside your uterus. There are different ways to remove the lining of your uterus.   Radiofrequency  This method uses a radiofrequency-alternating electric current to remove the lining of the uterus.  Cryotherapy This method uses extreme cold to freeze the lining of the uterus.  Heated-Free Liquid  This method uses heated salt (saline) solution to remove the lining of the uterus.  Microwave This method uses high-energy microwaves to heat  up the lining of the uterus to remove it.  Thermal balloon  This method involves inserting a catheter with a balloon tip into the uterus. The balloon tip is filled with heated fluid to remove the lining of the uterus. AFTER THE PROCEDURE  After your procedure, do not have sexual intercourse or insert anything into your vagina until permitted by your health care provider. After the procedure, you may experience:  Cramps.  Vaginal discharge.  Frequent urination. Document Released: 11/15/2003 Document Revised: 09/07/2012 Document Reviewed: 06/08/2012 [facility] Patient Information 2014 [place].    PATIENT INSTRUCTIONS POST-ANESTHESIA  IMMEDIATELY FOLLOWING SURGERY:  Do not drive or operate machinery for the first twenty four hours after surgery.  Do not make any important decisions for twenty four hours after surgery or while taking narcotic pain medications or sedatives.  If you develop intractable nausea and vomiting or a severe headache please notify your doctor immediately.  FOLLOW-UP:  Please make an appointment with your surgeon as instructed. You do not need to follow up with anesthesia unless specifically instructed to do so.  WOUND CARE INSTRUCTIONS (if applicable):  Keep a dry clean dressing on the anesthesia/puncture wound site if there is drainage.  Once the wound has quit draining you may leave it open to air.  Generally you should leave the bandage intact for twenty four hours unless there is drainage.  If the epidural site drains for more than 36-48 hours please call the anesthesia department.  QUESTIONS?:  Please feel free to call your physician or the hospital operator if you have any questions, and they will be happy to assist you.

## 2013-06-05 ENCOUNTER — Telehealth: Payer: Self-pay

## 2013-06-05 NOTE — Telephone Encounter (Signed)
Pt states having surgery on 06/07/2013 (endometrial ablation). Requesting a work note for 05/20, 05/21,06/09/2013 faxed to [phone]. Note faxed per pt request.

## 2013-06-07 ENCOUNTER — Encounter (HOSPITAL_COMMUNITY): Payer: Self-pay

## 2013-06-07 ENCOUNTER — Ambulatory Visit (HOSPITAL_COMMUNITY)
Admission: RE | Admit: 2013-06-07 | Discharge: 2013-06-07 | Disposition: A | Discharge status: Home / Self Care | Payer: 59 | Source: Ambulatory Visit | Attending: Obstetrics & Gynecology | Admitting: Obstetrics & Gynecology

## 2013-06-07 ENCOUNTER — Encounter (HOSPITAL_COMMUNITY): Payer: 59 | Admitting: Anesthesiology

## 2013-06-07 ENCOUNTER — Encounter (HOSPITAL_COMMUNITY)
Admission: RE | Disposition: A | Discharge status: Home / Self Care | Payer: Self-pay | Source: Ambulatory Visit | Attending: Obstetrics & Gynecology

## 2013-06-07 ENCOUNTER — Ambulatory Visit (HOSPITAL_COMMUNITY): Payer: 59 | Admitting: Anesthesiology

## 2013-06-07 DIAGNOSIS — N920 Excessive and frequent menstruation with regular cycle: Secondary | ICD-10-CM

## 2013-06-07 DIAGNOSIS — Z885 Allergy status to narcotic agent status: Secondary | ICD-10-CM | POA: Insufficient documentation

## 2013-06-07 DIAGNOSIS — N946 Dysmenorrhea, unspecified: Secondary | ICD-10-CM | POA: Insufficient documentation

## 2013-06-07 DIAGNOSIS — Z881 Allergy status to other antibiotic agents status: Secondary | ICD-10-CM | POA: Insufficient documentation

## 2013-06-07 DIAGNOSIS — Z888 Allergy status to other drugs, medicaments and biological substances status: Secondary | ICD-10-CM | POA: Insufficient documentation

## 2013-06-07 DIAGNOSIS — Z9889 Other specified postprocedural states: Secondary | ICD-10-CM

## 2013-06-07 HISTORY — PX: DILITATION & CURRETTAGE/HYSTROSCOPY WITH THERMACHOICE ABLATION: SHX5569

## 2013-06-07 SURGERY — DILATATION & CURETTAGE/HYSTEROSCOPY WITH THERMACHOICE ABLATION
Anesthesia: General | Site: Vagina

## 2013-06-07 MED ORDER — LACTATED RINGERS IV SOLN
INTRAVENOUS | Status: DC
  Administered 2013-06-07: 10:00:00 via INTRAVENOUS

## 2013-06-07 MED ORDER — LACTATED RINGERS IV SOLN
INTRAVENOUS | Status: DC | PRN
  Administered 2013-06-07: 09:00:00 via INTRAVENOUS

## 2013-06-07 MED ORDER — ONDANSETRON HCL 4 MG/2ML IJ SOLN
4.0000 mg | Freq: Once | INTRAMUSCULAR | Status: AC | PRN
  Administered 2013-06-07: 4 mg via INTRAVENOUS

## 2013-06-07 MED ORDER — ACETAMINOPHEN 500 MG PO TABS
ORAL_TABLET | ORAL | Status: AC
  Filled 2013-06-07: qty 1

## 2013-06-07 MED ORDER — SCOPOLAMINE 1 MG/3DAYS TD PT72
MEDICATED_PATCH | TRANSDERMAL | Status: AC
  Filled 2013-06-07: qty 1

## 2013-06-07 MED ORDER — OXYCODONE-ACETAMINOPHEN 5-325 MG PO TABS: 1.0000 | ORAL_TABLET | Freq: Four times a day (QID) | ORAL | Status: DC | PRN

## 2013-06-07 MED ORDER — PROMETHAZINE HCL 25 MG/ML IJ SOLN
INTRAMUSCULAR | Status: AC
  Filled 2013-06-07: qty 1

## 2013-06-07 MED ORDER — SCOPOLAMINE 1 MG/3DAYS TD PT72
1.0000 | MEDICATED_PATCH | Freq: Once | TRANSDERMAL | Status: DC
  Administered 2013-06-07: 1.5 mg via TRANSDERMAL

## 2013-06-07 MED ORDER — DEXAMETHASONE SODIUM PHOSPHATE 4 MG/ML IJ SOLN
INTRAMUSCULAR | Status: AC
  Filled 2013-06-07: qty 1

## 2013-06-07 MED ORDER — FENTANYL CITRATE 0.05 MG/ML IJ SOLN
INTRAMUSCULAR | Status: AC
  Filled 2013-06-07: qty 2

## 2013-06-07 MED ORDER — ARTIFICIAL TEARS OP OINT
TOPICAL_OINTMENT | OPHTHALMIC | Status: DC | PRN
  Administered 2013-06-07: 1 via OPHTHALMIC

## 2013-06-07 MED ORDER — ONDANSETRON HCL 4 MG/2ML IJ SOLN
4.0000 mg | Freq: Once | INTRAMUSCULAR | Status: AC
  Administered 2013-06-07: 4 mg via INTRAVENOUS

## 2013-06-07 MED ORDER — FENTANYL CITRATE 0.05 MG/ML IJ SOLN
25.0000 ug | INTRAMUSCULAR | Status: AC
  Administered 2013-06-07 (×2): 25 ug via INTRAVENOUS

## 2013-06-07 MED ORDER — LIDOCAINE HCL (CARDIAC) 20 MG/ML IV SOLN
INTRAVENOUS | Status: DC | PRN
  Administered 2013-06-07: 50 mg via INTRAVENOUS

## 2013-06-07 MED ORDER — KETOROLAC TROMETHAMINE 30 MG/ML IJ SOLN
INTRAMUSCULAR | Status: AC
  Filled 2013-06-07: qty 1

## 2013-06-07 MED ORDER — GLYCOPYRROLATE 0.2 MG/ML IJ SOLN
0.2000 mg | Freq: Once | INTRAMUSCULAR | Status: AC
  Administered 2013-06-07: 0.2 mg via INTRAVENOUS

## 2013-06-07 MED ORDER — FENTANYL CITRATE 0.05 MG/ML IJ SOLN: 25.0000 ug | INTRAMUSCULAR | Status: DC | PRN

## 2013-06-07 MED ORDER — KETOROLAC TROMETHAMINE 10 MG PO TABS: 10.0000 mg | ORAL_TABLET | Freq: Three times a day (TID) | ORAL | Status: DC | PRN

## 2013-06-07 MED ORDER — DEXAMETHASONE SODIUM PHOSPHATE 4 MG/ML IJ SOLN
4.0000 mg | Freq: Once | INTRAMUSCULAR | Status: AC
  Administered 2013-06-07: 4 mg via INTRAVENOUS

## 2013-06-07 MED ORDER — 0.9 % SODIUM CHLORIDE (POUR BTL) OPTIME
TOPICAL | Status: DC | PRN
  Administered 2013-06-07: 1000 mL

## 2013-06-07 MED ORDER — SODIUM CHLORIDE 0.9 % IJ SOLN
INTRAMUSCULAR | Status: AC
  Filled 2013-06-07: qty 10

## 2013-06-07 MED ORDER — ONDANSETRON HCL 8 MG PO TABS: 8.0000 mg | ORAL_TABLET | Freq: Three times a day (TID) | ORAL | Status: DC | PRN

## 2013-06-07 MED ORDER — FENTANYL CITRATE 0.05 MG/ML IJ SOLN
INTRAMUSCULAR | Status: AC
  Filled 2013-06-07: qty 5

## 2013-06-07 MED ORDER — PROPOFOL 10 MG/ML IV BOLUS
INTRAVENOUS | Status: DC | PRN
  Administered 2013-06-07: 150 mg via INTRAVENOUS

## 2013-06-07 MED ORDER — ONDANSETRON HCL 4 MG/2ML IJ SOLN
INTRAMUSCULAR | Status: AC
  Filled 2013-06-07: qty 2

## 2013-06-07 MED ORDER — CEFAZOLIN SODIUM-DEXTROSE 2-3 GM-% IV SOLR
INTRAVENOUS | Status: AC
  Filled 2013-06-07: qty 50

## 2013-06-07 MED ORDER — PROMETHAZINE HCL 25 MG/ML IJ SOLN
12.5000 mg | Freq: Once | INTRAMUSCULAR | Status: AC
  Administered 2013-06-07: 12.5 mg via INTRAVENOUS

## 2013-06-07 MED ORDER — MIDAZOLAM HCL 2 MG/2ML IJ SOLN
1.0000 mg | INTRAMUSCULAR | Status: DC | PRN
  Administered 2013-06-07 (×2): 2 mg via INTRAVENOUS
  Filled 2013-06-07: qty 2

## 2013-06-07 MED ORDER — CEFAZOLIN SODIUM-DEXTROSE 2-3 GM-% IV SOLR
2.0000 g | INTRAVENOUS | Status: AC
  Administered 2013-06-07: 2 g via INTRAVENOUS

## 2013-06-07 MED ORDER — ACETAMINOPHEN 500 MG PO TABS
500.0000 mg | ORAL_TABLET | Freq: Once | ORAL | Status: AC
  Administered 2013-06-07: 500 mg via ORAL
  Filled 2013-06-07: qty 1

## 2013-06-07 MED ORDER — FENTANYL CITRATE 0.05 MG/ML IJ SOLN
INTRAMUSCULAR | Status: DC | PRN
  Administered 2013-06-07: 100 ug via INTRAVENOUS
  Administered 2013-06-07 (×3): 50 ug via INTRAVENOUS

## 2013-06-07 MED ORDER — SODIUM CHLORIDE 0.9 % IR SOLN
Status: DC | PRN
  Administered 2013-06-07: 1000 mL

## 2013-06-07 MED ORDER — GLYCOPYRROLATE 0.2 MG/ML IJ SOLN
INTRAMUSCULAR | Status: AC
  Filled 2013-06-07: qty 1

## 2013-06-07 MED ORDER — DEXTROSE 5 % IV SOLN
INTRAVENOUS | Status: DC | PRN
  Administered 2013-06-07: 22 mL via INTRAVENOUS

## 2013-06-07 MED ORDER — KETOROLAC TROMETHAMINE 30 MG/ML IJ SOLN
30.0000 mg | Freq: Once | INTRAMUSCULAR | Status: AC
  Administered 2013-06-07: 30 mg via INTRAVENOUS

## 2013-06-07 MED ORDER — MIDAZOLAM HCL 2 MG/2ML IJ SOLN
INTRAMUSCULAR | Status: AC
  Filled 2013-06-07: qty 2

## 2013-06-07 SURGICAL SUPPLY — 33 items
BAG DECANTER FOR FLEXI CONT (MISCELLANEOUS) ×2 IMPLANT
BAG HAMPER (MISCELLANEOUS) ×2 IMPLANT
CATH THERMACHOICE III (CATHETERS) ×2 IMPLANT
CLOTH BEACON ORANGE TIMEOUT ST (SAFETY) ×2 IMPLANT
COVER LIGHT HANDLE STERIS (MISCELLANEOUS) ×4 IMPLANT
COVER MAYO STAND XLG (DRAPE) ×2 IMPLANT
FORMALIN 10 PREFIL 120ML (MISCELLANEOUS) ×2 IMPLANT
GAUZE SPONGE 4X4 16PLY XRAY LF (GAUZE/BANDAGES/DRESSINGS) ×2 IMPLANT
GLOVE BIOGEL PI IND STRL 7.0 (GLOVE) ×1 IMPLANT
GLOVE BIOGEL PI IND STRL 8 (GLOVE) ×1 IMPLANT
GLOVE BIOGEL PI INDICATOR 7.0 (GLOVE) ×1
GLOVE BIOGEL PI INDICATOR 8 (GLOVE) ×1
GLOVE ECLIPSE 6.5 STRL STRAW (GLOVE) ×2 IMPLANT
GLOVE ECLIPSE 8.0 STRL XLNG CF (GLOVE) ×2 IMPLANT
GLOVE EXAM NITRILE PF LG BLUE (GLOVE) ×2 IMPLANT
GOWN STRL REUS W/TWL LRG LVL3 (GOWN DISPOSABLE) ×2 IMPLANT
GOWN STRL REUS W/TWL XL LVL3 (GOWN DISPOSABLE) ×2 IMPLANT
INST SET HYSTEROSCOPY (KITS) ×2 IMPLANT
IV D5W 500ML (IV SOLUTION) ×2 IMPLANT
IV NS 1000ML (IV SOLUTION) ×1
IV NS 1000ML BAXH (IV SOLUTION) ×1 IMPLANT
KIT ROOM TURNOVER AP CYSTO (KITS) ×2 IMPLANT
MANIFOLD NEPTUNE II (INSTRUMENTS) ×2 IMPLANT
MARKER SKIN DUAL TIP RULER LAB (MISCELLANEOUS) ×2 IMPLANT
NS IRRIG 1000ML POUR BTL (IV SOLUTION) ×2 IMPLANT
PACK BASIC III (CUSTOM PROCEDURE TRAY) ×1
PACK SRG BSC III STRL LF ECLPS (CUSTOM PROCEDURE TRAY) ×1 IMPLANT
PAD ARMBOARD 7.5X6 YLW CONV (MISCELLANEOUS) ×2 IMPLANT
PAD TELFA 3X4 1S STER (GAUZE/BANDAGES/DRESSINGS) ×2 IMPLANT
SET BASIN LINEN APH (SET/KITS/TRAYS/PACK) ×2 IMPLANT
SET IRRIG Y TYPE TUR BLADDER L (SET/KITS/TRAYS/PACK) ×2 IMPLANT
SHEET LAVH (DRAPES) ×2 IMPLANT
YANKAUER SUCT BULB TIP 10FT TU (MISCELLANEOUS) ×2 IMPLANT

## 2013-06-07 NOTE — Discharge Instructions (Signed)

## 2013-06-07 NOTE — Anesthesia Preprocedure Evaluation (Signed)
Anesthesia Evaluation  Patient identified by MRN, date of birth, ID band Patient awake    Reviewed: Allergy & Precautions, H&P , NPO status , Patient's Chart, lab work & pertinent test results  History of Anesthesia Complications (+) PONV and history of anesthetic complications  Airway Mallampati: II TM Distance: >3 FB     Dental  (+) Teeth Intact   Pulmonary neg pulmonary ROS,  breath sounds clear to auscultation        Cardiovascular + Valvular Problems/Murmurs MVP Rate:Normal     Neuro/Psych  Headaches,    GI/Hepatic negative GI ROS,   Endo/Other    Renal/GU      Musculoskeletal   Abdominal   Peds  Hematology   Anesthesia Other Findings   Reproductive/Obstetrics                           Anesthesia Physical Anesthesia Plan  ASA: II  Anesthesia Plan: General   Post-op Pain Management:    Induction: Intravenous  Airway Management Planned: LMA  Additional Equipment:   Intra-op Plan:   Post-operative Plan: Extubation in OR  Informed Consent: I have reviewed the patients History and Physical, chart, labs and discussed the procedure including the risks, benefits and alternatives for the proposed anesthesia with the patient or authorized representative who has indicated his/her understanding and acceptance.     Plan Discussed with:   Anesthesia Plan Comments:         Anesthesia Quick Evaluation

## 2013-06-07 NOTE — Transfer of Care (Signed)
Immediate Anesthesia Transfer of Care Note  Patient: Laurie Thompson  Procedure(s) Performed: Procedure(s) with comments: DILATATION & CURETTAGE/HYSTEROSCOPY WITH THERMACHOICE ABLATION (N/A) - total time 12 minutes; 68-88 degrees celcius; 14ml of D5W in and 33ml of D5W out  Patient Location: PACU  Anesthesia Type:General  Level of Consciousness: sedated and patient cooperative  Airway & Oxygen Therapy: Patient Spontanous Breathing and Patient connected to face mask oxygen  Post-op Assessment: Report given to PACU RN and Post -op Vital signs reviewed and stable  Post vital signs: Reviewed and stable  Complications: No apparent anesthesia complications

## 2013-06-07 NOTE — Op Note (Signed)
Preoperative diagnosis: Menometrorrhagia                                        Dysmenorrhea   Postoperative diagnoses: Same as above   Procedure: Hysteroscopy, uterine curettage, endometrial ablation  Surgeon: Elonda Husky MD  Anesthesia: Laryngeal mask airway  Findings: The endometrium was normal. There were no fibroid or other abnormalities. There was a clot and maybe polyp asssociated, it was difficult to tell for sure.  In any event the endometrium was aggressively curetted  Description of operation: The patient was taken to the operating room and placed in the supine position. She underwent general anesthesia using the laryngeal mask airway. She was placed in the dorsal lithotomy position and prepped and draped in the usual sterile fashion. A Graves speculum was placed and the anterior cervical lip was grasped with a single-tooth tenaculum. The cervix was dilated serially to allow passage of the hysteroscope. Diagnostic hysteroscopy was performed and was found to be normal. A vigorous uterine curettage was then performed and all tissue sent to pathology for evaluation. The ThermaChoice 3 endometrial ablation balloon was then used were 22 cc of D5W was required to maintain a pressure of 190-200 mm of mercury throughout the procedure. Toatl therapy time was 12:00.  All of the equipment worked well throughout the procedure. All of the fluid was returned at the end of the procedure. The patient was awakened from anesthesia and taken to the recovery room in good stable condition all counts were correct. She received 2 g of Ancef and 30 mg of Toradol preoperatively. She will be discharged from the recovery room and followed up in the office in 1- 2 weeks.  [name] 06/07/2013 11:14 AM

## 2013-06-07 NOTE — H&P (Signed)
Preoperative History and Physical  Laurie Thompson is a 38 y.o. [phone] with Patient's last menstrual period was 06/07/2013. admitted for a hysteroscopy uterine curettage endometrial ablation with removal of endometrial polyp.  Pt has had long history of heavy periods and we placed her on megestrol last year.  She had been amenorrheic for 12 months but began bleeding 5 weeks ago daily, heavy, sonogram reveals an endometrial mass, favor polyp vs fibroid. As a result admitted for removal PMH:    Past Medical History  Diagnosis Date  . Persistent headaches   . Mitral valve prolapse     had echo 20 yrs ago no meds no prophylatic antibiotics  . Abnormal uterine bleeding (AUB) 05/22/2013  . PONV (postoperative nausea and vomiting)     PSH:     Past Surgical History  Procedure Laterality Date  . Dilation and curettage of uterus    . Cholecystectomy  02/15/2006    lap. chole.  . Gastrocnemius recession  05/01/2009    left gastroc. slide  . Foot surgery  02/27/2009    percutaneous microfasciotomy left foot  . Wisdom tooth extraction    . Plantar fascia release  02-2009  . Breast reduction surgery  10/06/2011    Procedure: MAMMARY REDUCTION  (BREAST);  Surgeon: Crissie Reese, MD;  Location: [facility];  Service: Plastics;  Laterality: Bilateral;    POb/GynH:      OB History   Grav Para Term Preterm Abortions TAB SAB Ect Mult Living   4 3   1  1   3       SH:   History  Substance Use Topics  . Smoking status: Never Smoker   . Smokeless tobacco: Never Used  . Alcohol Use: No    FH:    Family History  Problem Relation Age of Onset  . Hyperlipidemia Mother   . Macular degeneration Mother   . Cancer Father     lung cancer  . COPD Father   . Lung disease Father   . Heart disease Maternal Grandmother   . Lung disease Maternal Grandmother   . Hypertension Sister   . Fibromyalgia Sister   . GER disease Brother   . Cancer Paternal Aunt     cervical or ovarian   . Heart  disease Maternal Grandfather      Allergies:  Allergies  Allergen Reactions  . Dilaudid [Hydromorphone Hcl] Anaphylaxis    Throat sweling  . Erythromycin     Severe GI side effects  . Hydrocodeine [Dihydrocodeine] Itching  . Hibiclens [Chlorhexidine Gluconate] Rash    Medications:      Current facility-administered medications:ceFAZolin (ANCEF) IVPB 2 g/50 mL premix, 2 g, Intravenous, On Call to OR, [name], MD;  lactated ringers infusion, , Intravenous, Continuous, Lerry Liner, MD, Last Rate: 75 mL/hr at 06/07/13 0942;  midazolam (VERSED) injection 1-2 mg, 1-2 mg, Intravenous, Q5 Min x 3 PRN, Lerry Liner, MD, 2 mg at 06/07/13 0943 scopolamine (TRANSDERM-SCOP) 1 MG/3DAYS 1.5 mg, 1 patch, Transdermal, Once, Lerry Liner, MD, 1.5 mg at 06/07/13 1478  Review of Systems:   Review of Systems  Constitutional: Negative for fever, chills, weight loss, malaise/fatigue and diaphoresis.  HENT: Negative for hearing loss, ear pain, nosebleeds, congestion, sore throat, neck pain, tinnitus and ear discharge.   Eyes: Negative for blurred vision, double vision, photophobia, pain, discharge and redness.  Respiratory: Negative for cough, hemoptysis, sputum production, shortness of breath, wheezing and stridor.   Cardiovascular: Negative for chest  pain, palpitations, orthopnea, claudication, leg swelling and PND.  Gastrointestinal: negative for abdominal pain. Negative for heartburn, nausea, vomiting, diarrhea, constipation, blood in stool and melena.  Genitourinary: Negative for dysuria, urgency, frequency, hematuria and flank pain.  Musculoskeletal: Negative for myalgias, back pain, joint pain and falls.  Skin: Negative for itching and rash.  Neurological: Negative for dizziness, tingling, tremors, sensory change, speech change, focal weakness, seizures, loss of consciousness, weakness and headaches.  Endo/Heme/Allergies: Negative for environmental allergies and polydipsia. Does not  bruise/bleed easily.  Psychiatric/Behavioral: Negative for depression, suicidal ideas, hallucinations, memory loss and substance abuse. The patient is not nervous/anxious and does not have insomnia.      PHYSICAL EXAM:  Blood pressure 127/79, pulse 100, temperature 98.2 F (36.8 C), temperature source Oral, resp. rate 27, last menstrual period 06/07/2013, SpO2 94.00%.    Vitals reviewed. Constitutional: She is oriented to person, place, and time. She appears well-developed and well-nourished.  HENT:  Head: Normocephalic and atraumatic.  Right Ear: External ear normal.  Left Ear: External ear normal.  Nose: Nose normal.  Mouth/Throat: Oropharynx is clear and moist.  Eyes: Conjunctivae and EOM are normal. Pupils are equal, round, and reactive to light. Right eye exhibits no discharge. Left eye exhibits no discharge. No scleral icterus.  Neck: Normal range of motion. Neck supple. No tracheal deviation present. No thyromegaly present.  Cardiovascular: Normal rate, regular rhythm, normal heart sounds and intact distal pulses.  Exam reveals no gallop and no friction rub.   No murmur heard. Respiratory: Effort normal and breath sounds normal. No respiratory distress. She has no wheezes. She has no rales. She exhibits no tenderness.  GI: Soft. Bowel sounds are normal. She exhibits no distension and no mass. There is tenderness. There is no rebound and no guarding.  Genitourinary:       Vulva is normal without lesions Vagina is pink moist without discharge Cervix normal in appearance and pap is normal Uterus is normal sized by songram with multiple fibroids not submucosal Adnexa is negative with normal sized ovaries by sonogram  Musculoskeletal: Normal range of motion. She exhibits no edema and no tenderness.  Neurological: She is alert and oriented to person, place, and time. She has normal reflexes. She displays normal reflexes. No cranial nerve deficit. She exhibits normal muscle tone.  Coordination normal.  Skin: Skin is warm and dry. No rash noted. No erythema. No pallor.  Psychiatric: She has a normal mood and affect. Her behavior is normal. Judgment and thought content normal.    Labs: Results for orders placed during the hospital encounter of 06/02/13 (from the past 336 hour(s))  URINALYSIS, ROUTINE W REFLEX MICROSCOPIC   Collection Time    06/02/13  1:17 PM      Result Value Ref Range   Color, Urine RED (*) YELLOW   APPearance HAZY (*) CLEAR   Specific Gravity, Urine >1.030 (*) 1.005 - 1.030   pH 5.0  5.0 - 8.0   Glucose, UA 100 (*) NEGATIVE mg/dL   Hgb urine dipstick LARGE (*) NEGATIVE   Bilirubin Urine NEGATIVE  NEGATIVE   Ketones, ur 15 (*) NEGATIVE mg/dL   Protein, ur >300 (*) NEGATIVE mg/dL   Urobilinogen, UA 4.0 (*) 0.0 - 1.0 mg/dL   Nitrite POSITIVE (*) NEGATIVE   Leukocytes, UA MODERATE (*) NEGATIVE  URINE MICROSCOPIC-ADD ON   Collection Time    06/02/13  1:17 PM      Result Value Ref Range   Squamous Epithelial / LPF RARE  RARE   WBC, UA 7-10  <3 WBC/hpf   RBC / HPF TOO NUMEROUS TO COUNT  <3 RBC/hpf   Bacteria, UA FEW (*) RARE  COMPREHENSIVE METABOLIC PANEL   Collection Time    06/02/13  1:30 PM      Result Value Ref Range   Sodium 141  137 - 147 mEq/L   Potassium 4.2  3.7 - 5.3 mEq/L   Chloride 101  96 - 112 mEq/L   CO2 25  19 - 32 mEq/L   Glucose, Bld 90  70 - 99 mg/dL   BUN 17  6 - 23 mg/dL   Creatinine, Ser 0.80  0.50 - 1.10 mg/dL   Calcium 9.8  8.4 - 10.5 mg/dL   Total Protein 7.3  6.0 - 8.3 g/dL   Albumin 4.5  3.5 - 5.2 g/dL   AST 23  0 - 37 U/L   ALT 30  0 - 35 U/L   Alkaline Phosphatase 90  39 - 117 U/L   Total Bilirubin 0.5  0.3 - 1.2 mg/dL   GFR calc non Af Amer >90  >90 mL/min   GFR calc Af Amer >90  >90 mL/min  HCG, QUANTITATIVE, PREGNANCY   Collection Time    06/02/13  1:30 PM      Result Value Ref Range   hCG, Beta Chain, Quant, S <1  <5 mIU/mL    EKG: Orders placed in visit on 06/02/13  . EKG 12-LEAD     Imaging Studies: US Transvaginal Non-ob  2013-06-02   GYNECOLOGIC SONOGRAM   Laurie Thompson is a 38 y.o. [id number] LMP-04/20/2013 for a pelvic sonogram for  Abnormal Uterine Bleeding since 04/20/2013.  Uterus                      8.0 x 5.6 x 4.4 cm, retroverted uterus noted  no myometrial masses noted  Endometrium          20.2 mm, asymmetrical, with ?clot vs ?mass noted  within cavity  Right ovary             3.3 x 2.5 x 2.0 cm,   Left ovary                3.5 x 2.2 x 1.7 cm,   No free fluid or adnexal masses noted within pelvis  Technician Comments:  Retroverted uterus noted with asymmetrical thickened endometrium=20.53mm,  bilateral adnexa/ovaries appears WNL, no free fluid or adnexal masses  noted within pelvis   [name] 05/29/2013 2:32 PM   Clinical Impression and recommendations:  I have reviewed the sonogram results above, combined with the patient's  current clinical course, below are my impressions and any appropriate  recommendations for management based on the sonographic findings.  Endometrial mass, fibroid vs polyp Otherwise normal sonogram  Clinically recommend hysteroscopy with removal of mass and endometrial  curettage, ablation if pt requests  [name] 02-Jun-2013 8:25 AM      Assessment: Endometrial polyp vs fibroid Patient Active Problem List   Diagnosis Date Noted  . Fibroids, submucosal 06/02/2013  . Abnormal uterine bleeding (AUB) 05/22/2013    Plan: Hysteroscopy uterine curettage endometrial ablation removal of endometrial polyp  [name] 06/07/2013 9:59 AM

## 2013-06-07 NOTE — Anesthesia Procedure Notes (Signed)
Procedure Name: LMA Insertion Date/Time: 06/07/2013 10:28 AM Performed by: Andree Elk, Bernon Arviso A Pre-anesthesia Checklist: Patient identified and Timeout performed Patient Re-evaluated:Patient Re-evaluated prior to inductionOxygen Delivery Method: Circle system utilized Preoxygenation: Pre-oxygenation with 100% oxygen Intubation Type: IV induction Ventilation: Mask ventilation without difficulty LMA: LMA inserted LMA Size: 4.0 Number of attempts: 1 Placement Confirmation: positive ETCO2 and breath sounds checked- equal and bilateral Tube secured with: Tape Dental Injury: Teeth and Oropharynx as per pre-operative assessment

## 2013-06-07 NOTE — Anesthesia Postprocedure Evaluation (Signed)
  Anesthesia Post-op Note  Patient: Laurie Thompson  Procedure(s) Performed: Procedure(s) with comments: DILATATION & CURETTAGE/HYSTEROSCOPY WITH THERMACHOICE ABLATION (N/A) - total time 12 minutes; 68-88 degrees celcius; 60ml of D5W in and 52ml of D5W out  Patient Location: PACU  Anesthesia Type:General  Level of Consciousness: sedated and patient cooperative  Airway and Oxygen Therapy: Patient Spontanous Breathing and Patient connected to face mask oxygen  Post-op Pain: none  Post-op Assessment: Post-op Vital signs reviewed, Patient's Cardiovascular Status Stable, Respiratory Function Stable, Patent Airway, No signs of Nausea or vomiting and Pain level controlled  Post-op Vital Signs: Reviewed and stable  Last Vitals:  Filed Vitals:   06/07/13 1123  BP: 122/66  Pulse: 113  Temp: 37 C  Resp: 16    Complications: No apparent anesthesia complications

## 2013-06-08 ENCOUNTER — Encounter (HOSPITAL_COMMUNITY): Payer: Self-pay | Admitting: Obstetrics & Gynecology

## 2013-06-09 ENCOUNTER — Other Ambulatory Visit: Payer: 59 | Admitting: Adult Health

## 2013-06-13 ENCOUNTER — Encounter: Payer: Self-pay | Admitting: Obstetrics & Gynecology

## 2013-06-13 ENCOUNTER — Ambulatory Visit (INDEPENDENT_AMBULATORY_CARE_PROVIDER_SITE_OTHER): Payer: 59 | Admitting: Obstetrics & Gynecology

## 2013-06-13 VITALS — BP 120/80 | Temp 98.5°F | Wt 192.0 lb

## 2013-06-13 DIAGNOSIS — Z9889 Other specified postprocedural states: Secondary | ICD-10-CM

## 2013-06-13 NOTE — Progress Notes (Signed)
Patient ID: Laurie Thompson, female   DOB: September 23, 1975, 37 y.o.   MRN: [id number] Post op day # 7 hysteroscopy uterine curettage endometrial ablation Pathology is reviewed with pt and is benign  No problems with surgery and besides feeling a little off pt has had no post opo problems needs to have a BM  No burning with urination, frequency or urgency No nausea, vomiting or diarrhea Nor fever chills or other constitutional symptoms   Exam NEFG Vagina pink watery discharge Uterus non tender Adnexa negative  Status post endomettrial ablation  Plan Follow up for yearly as needed

## 2013-06-14 ENCOUNTER — Telehealth: Payer: Self-pay | Admitting: *Deleted

## 2013-06-14 ENCOUNTER — Encounter: Payer: 59 | Admitting: Obstetrics & Gynecology

## 2013-06-14 NOTE — Telephone Encounter (Signed)
Pt informed FMLA forms complete. Pt requesting note to be faxed to [phone] stating pt can return to work with no restrictions.

## 2013-06-16 DIAGNOSIS — Z029 Encounter for administrative examinations, unspecified: Secondary | ICD-10-CM

## 2013-11-20 ENCOUNTER — Encounter: Payer: Self-pay | Admitting: Obstetrics & Gynecology

## 2013-12-04 NOTE — Telephone Encounter (Signed)
err

## 2013-12-12 ENCOUNTER — Other Ambulatory Visit: Payer: Self-pay | Admitting: Adult Health

## 2014-04-27 ENCOUNTER — Other Ambulatory Visit: Payer: Self-pay | Admitting: Family Medicine

## 2014-04-27 DIAGNOSIS — Z1231 Encounter for screening mammogram for malignant neoplasm of breast: Secondary | ICD-10-CM

## 2014-04-30 ENCOUNTER — Other Ambulatory Visit: Payer: Self-pay | Admitting: Family Medicine

## 2014-04-30 DIAGNOSIS — Z1231 Encounter for screening mammogram for malignant neoplasm of breast: Secondary | ICD-10-CM

## 2014-04-30 DIAGNOSIS — Z9889 Other specified postprocedural states: Secondary | ICD-10-CM

## 2014-05-02 ENCOUNTER — Ambulatory Visit
Admission: RE | Admit: 2014-05-02 | Discharge: 2014-05-02 | Disposition: A | Discharge status: Home / Self Care | Payer: BLUE CROSS/BLUE SHIELD | Source: Ambulatory Visit | Attending: Family Medicine | Admitting: Family Medicine

## 2014-05-02 DIAGNOSIS — Z9889 Other specified postprocedural states: Secondary | ICD-10-CM

## 2014-05-02 DIAGNOSIS — Z1231 Encounter for screening mammogram for malignant neoplasm of breast: Secondary | ICD-10-CM

## 2014-11-15 ENCOUNTER — Other Ambulatory Visit: Payer: 59 | Admitting: Adult Health

## 2014-12-17 ENCOUNTER — Other Ambulatory Visit: Payer: Self-pay | Admitting: Adult Health

## 2015-01-21 ENCOUNTER — Other Ambulatory Visit: Payer: Self-pay | Admitting: Adult Health

## 2015-01-29 ENCOUNTER — Encounter: Payer: Self-pay | Admitting: Adult Health

## 2015-01-29 ENCOUNTER — Ambulatory Visit (INDEPENDENT_AMBULATORY_CARE_PROVIDER_SITE_OTHER): Payer: BLUE CROSS/BLUE SHIELD | Admitting: Adult Health

## 2015-01-29 ENCOUNTER — Other Ambulatory Visit (HOSPITAL_COMMUNITY)
Admission: RE | Admit: 2015-01-29 | Discharge: 2015-01-29 | Disposition: A | Discharge status: Home / Self Care | Payer: BLUE CROSS/BLUE SHIELD | Source: Ambulatory Visit | Attending: Adult Health | Admitting: Adult Health

## 2015-01-29 VITALS — BP 132/78 | HR 86 | Ht 67.0 in | Wt 203.0 lb

## 2015-01-29 DIAGNOSIS — Z9889 Other specified postprocedural states: Secondary | ICD-10-CM

## 2015-01-29 DIAGNOSIS — R195 Other fecal abnormalities: Secondary | ICD-10-CM

## 2015-01-29 DIAGNOSIS — Z1212 Encounter for screening for malignant neoplasm of rectum: Secondary | ICD-10-CM | POA: Diagnosis not present

## 2015-01-29 DIAGNOSIS — Z01419 Encounter for gynecological examination (general) (routine) without abnormal findings: Secondary | ICD-10-CM

## 2015-01-29 DIAGNOSIS — F439 Reaction to severe stress, unspecified: Secondary | ICD-10-CM

## 2015-01-29 DIAGNOSIS — Z1151 Encounter for screening for human papillomavirus (HPV): Secondary | ICD-10-CM | POA: Insufficient documentation

## 2015-01-29 HISTORY — DX: Reaction to severe stress, unspecified: F43.9

## 2015-01-29 HISTORY — DX: Other fecal abnormalities: R19.5

## 2015-01-29 LAB — HEMOCCULT GUIAC POC 1CARD (OFFICE): Fecal Occult Blood, POC: POSITIVE — AB

## 2015-01-29 MED ORDER — CITALOPRAM HYDROBROMIDE 20 MG PO TABS: 20.0000 mg | ORAL_TABLET | Freq: Every day | ORAL | Status: DC

## 2015-01-29 NOTE — Patient Instructions (Signed)
  Do 3 hemoccult cards and return  Mammogram yearly  Physical in 1 year, pap in 3 if normal

## 2015-01-29 NOTE — Progress Notes (Signed)
Patient ID: Laurie Thompson, female   DOB: 04-04-1975, 40 y.o.   MRN: [id number] History of Present Illness: Laurie Thompson is a 40 year old white female married in for a well woman gyn exam and pap, she had ablation but still has period every month but not as bad.She says if eats beans or nuts has pain in bowel.She is stressed at present.She works 2 jobs prn.   Current Medications, Allergies, Past Medical History, Past Surgical History, Family History and Social History were reviewed in [employer] record.     Review of Systems: Patient denies any headaches, hearing loss, fatigue, blurred vision, shortness of breath, chest pain, abdominal pain, problems with  urination, or intercourse. No joint pain or mood swings.See HPI for positives.    Physical Exam:BP 132/78 mmHg  Pulse 86  Ht 5\' 7"  (1.702 m)  Wt 203 lb (92.08 kg)  BMI 31.79 kg/m2  LMP 01/20/2015 General:  Well developed, well nourished, no acute distress Skin:  Warm and dry Neck:  Midline trachea, normal thyroid, good ROM, no lymphadenopathy Lungs; Clear to auscultation bilaterally Breast:  No dominant palpable mass, retraction, or nipple discharge,sp breast reduction  Cardiovascular: Regular rate and rhythm Abdomen:  Soft, non tender, no hepatosplenomegaly Pelvic:  External genitalia is normal in appearance, no lesions.  The vagina is normal in appearance. Urethra has no lesions or masses. The cervix is bulbous, pap with HPV performed.  Uterus is felt to be normal size, shape, and contour.  No adnexal masses or tenderness noted.Bladder is non tender, no masses felt. Rectal: Good sphincter tone, no polyps, or hemorrhoids felt.  Hemoccult positive. Extremities/musculoskeletal:  No swelling or varicosities noted, no clubbing or cyanosis Psych:  No mood changes, alert and cooperative,seems happy Will get 3 hemoccult cards, if any + will refer to GI.   Impression: Well woman gyn exam with pap +fecal occult blood  test Stress  Sp ablation     Plan: Given 3 hemoccult cards to do at home Check CBC,CMP,TSH and lipids,A1c Mammogram yearly Physical in 1 year, pap in 3 if normal Refilled celexa 20 mg #30 take 1 daily with 11 refills

## 2015-01-30 LAB — CBC
HEMOGLOBIN: 13.4 g/dL (ref 11.1–15.9)
Hematocrit: 40.3 % (ref 34.0–46.6)
MCH: 30.1 pg (ref 26.6–33.0)
MCHC: 33.3 g/dL (ref 31.5–35.7)
MCV: 91 fL (ref 79–97)
Platelets: 281 10*3/uL (ref 150–379)
RBC: 4.45 x10E6/uL (ref 3.77–5.28)
RDW: 13.4 % (ref 12.3–15.4)
WBC: 9.8 10*3/uL (ref 3.4–10.8)

## 2015-01-30 LAB — LIPID PANEL
Chol/HDL Ratio: 5.4 ratio units — ABNORMAL HIGH (ref 0.0–4.4)
Cholesterol, Total: 193 mg/dL (ref 100–199)
HDL: 36 mg/dL — AB (ref >39)
TRIGLYCERIDES: 417 mg/dL — AB (ref 0–149)

## 2015-01-30 LAB — COMPREHENSIVE METABOLIC PANEL
ALBUMIN: 4.8 g/dL (ref 3.5–5.5)
ALT: 28 IU/L (ref 0–32)
AST: 25 IU/L (ref 0–40)
Albumin/Globulin Ratio: 2.2 (ref 1.1–2.5)
Alkaline Phosphatase: 80 IU/L (ref 39–117)
BUN/Creatinine Ratio: 17 (ref 9–23)
BUN: 12 mg/dL (ref 6–24)
Bilirubin Total: 0.3 mg/dL (ref 0.0–1.2)
CO2: 24 mmol/L (ref 18–29)
Calcium: 9.3 mg/dL (ref 8.7–10.2)
Chloride: 98 mmol/L (ref 96–106)
Creatinine, Ser: 0.69 mg/dL (ref 0.57–1.00)
GFR calc non Af Amer: 109 mL/min/{1.73_m2} (ref >59)
GFR, EST AFRICAN AMERICAN: 126 mL/min/{1.73_m2} (ref >59)
GLUCOSE: 117 mg/dL — AB (ref 65–99)
Globulin, Total: 2.2 g/dL (ref 1.5–4.5)
Potassium: 3.9 mmol/L (ref 3.5–5.2)
Sodium: 140 mmol/L (ref 134–144)
TOTAL PROTEIN: 7 g/dL (ref 6.0–8.5)

## 2015-01-30 LAB — HEMOGLOBIN A1C
Est. average glucose Bld gHb Est-mCnc: 120 mg/dL
Hgb A1c MFr Bld: 5.8 % — ABNORMAL HIGH (ref 4.8–5.6)

## 2015-01-30 LAB — TSH: TSH: 1.84 u[IU]/mL (ref 0.450–4.500)

## 2015-01-31 ENCOUNTER — Telehealth: Payer: Self-pay | Admitting: Adult Health

## 2015-01-31 NOTE — Telephone Encounter (Signed)
Left message with labs results and need to cut carbs and increase activity but needs to call me about lipid panel, may need meds, but can work on diet and recheck in 3 months

## 2015-02-01 ENCOUNTER — Telehealth: Payer: Self-pay | Admitting: Adult Health

## 2015-02-01 LAB — CYTOLOGY - PAP

## 2015-02-01 MED ORDER — NALTREXONE-BUPROPION HCL ER 8-90 MG PO TB12: ORAL_TABLET | ORAL | Status: DC

## 2015-02-01 NOTE — Telephone Encounter (Signed)
She wants to try contrave, will rx

## 2015-02-08 ENCOUNTER — Other Ambulatory Visit: Payer: BLUE CROSS/BLUE SHIELD

## 2015-02-08 ENCOUNTER — Telehealth: Payer: Self-pay | Admitting: Adult Health

## 2015-02-08 ENCOUNTER — Telehealth: Payer: Self-pay | Admitting: *Deleted

## 2015-02-08 NOTE — Telephone Encounter (Signed)
pt does not want to do contrave, now costs too much

## 2015-02-08 NOTE — Telephone Encounter (Signed)
Spoke with pt. Contrave is still $165.00 with insurance. Pt is requesting something else. Please advise. Thanks!! [place]

## 2015-02-08 NOTE — Telephone Encounter (Signed)
Left message letting pt know hemocult cards were all negative. Laurie Thompson

## 2015-02-08 NOTE — Telephone Encounter (Signed)
I received a fax from [employer] saying they approved Contrave. Reference # [id number]. Effective dates of the authorization: 02/06/15-08/04/15. Pt aware and [facility] in [facility] aware. [place]

## 2015-07-01 ENCOUNTER — Other Ambulatory Visit: Payer: Self-pay | Admitting: Adult Health

## 2015-07-01 DIAGNOSIS — Z1231 Encounter for screening mammogram for malignant neoplasm of breast: Secondary | ICD-10-CM

## 2015-08-12 ENCOUNTER — Ambulatory Visit
Admission: RE | Admit: 2015-08-12 | Discharge: 2015-08-12 | Disposition: A | Discharge status: Home / Self Care | Payer: BLUE CROSS/BLUE SHIELD | Source: Ambulatory Visit | Attending: Adult Health | Admitting: Adult Health

## 2015-08-12 DIAGNOSIS — Z1231 Encounter for screening mammogram for malignant neoplasm of breast: Secondary | ICD-10-CM

## 2016-02-21 ENCOUNTER — Other Ambulatory Visit: Payer: Self-pay | Admitting: Adult Health

## 2016-04-27 ENCOUNTER — Other Ambulatory Visit: Payer: Self-pay | Admitting: Adult Health

## 2016-04-29 DIAGNOSIS — E782 Mixed hyperlipidemia: Secondary | ICD-10-CM | POA: Diagnosis not present

## 2016-04-29 DIAGNOSIS — R002 Palpitations: Secondary | ICD-10-CM | POA: Diagnosis not present

## 2016-04-29 DIAGNOSIS — R739 Hyperglycemia, unspecified: Secondary | ICD-10-CM | POA: Diagnosis not present

## 2016-05-08 DIAGNOSIS — M7711 Lateral epicondylitis, right elbow: Secondary | ICD-10-CM | POA: Diagnosis not present

## 2017-04-29 DIAGNOSIS — J45901 Unspecified asthma with (acute) exacerbation: Secondary | ICD-10-CM | POA: Diagnosis not present

## 2017-08-11 DIAGNOSIS — J4540 Moderate persistent asthma, uncomplicated: Secondary | ICD-10-CM | POA: Diagnosis not present

## 2017-11-15 DIAGNOSIS — Z23 Encounter for immunization: Secondary | ICD-10-CM | POA: Diagnosis not present

## 2017-12-09 ENCOUNTER — Other Ambulatory Visit (HOSPITAL_COMMUNITY): Payer: Self-pay | Admitting: Respiratory Therapy

## 2017-12-09 DIAGNOSIS — R0602 Shortness of breath: Secondary | ICD-10-CM | POA: Diagnosis not present

## 2017-12-10 ENCOUNTER — Other Ambulatory Visit: Payer: Self-pay | Admitting: General Practice

## 2017-12-10 DIAGNOSIS — Z1231 Encounter for screening mammogram for malignant neoplasm of breast: Secondary | ICD-10-CM

## 2018-01-05 ENCOUNTER — Ambulatory Visit (HOSPITAL_COMMUNITY)
Admission: RE | Admit: 2018-01-05 | Discharge: 2018-01-05 | Disposition: A | Discharge status: Home / Self Care | Payer: 59 | Source: Ambulatory Visit | Attending: Pulmonary Disease | Admitting: Pulmonary Disease

## 2018-01-05 ENCOUNTER — Other Ambulatory Visit (HOSPITAL_COMMUNITY): Payer: Self-pay | Admitting: Pulmonary Disease

## 2018-01-05 ENCOUNTER — Other Ambulatory Visit (HOSPITAL_COMMUNITY)
Admission: RE | Admit: 2018-01-05 | Discharge: 2018-01-05 | Disposition: A | Discharge status: Home / Self Care | Payer: 59 | Source: Ambulatory Visit | Attending: Pulmonary Disease | Admitting: Pulmonary Disease

## 2018-01-05 DIAGNOSIS — R0602 Shortness of breath: Secondary | ICD-10-CM | POA: Insufficient documentation

## 2018-01-05 DIAGNOSIS — J4540 Moderate persistent asthma, uncomplicated: Secondary | ICD-10-CM

## 2018-01-05 DIAGNOSIS — R05 Cough: Secondary | ICD-10-CM | POA: Insufficient documentation

## 2018-01-05 LAB — PULMONARY FUNCTION TEST
DL/VA % PRED: 108 %
DL/VA: 5.58 ml/min/mmHg/L
DLCO cor % pred: 90 %
DLCO cor: 25.58 ml/min/mmHg
DLCO unc % pred: 88 %
DLCO unc: 25.01 ml/min/mmHg
FEF 25-75 Post: 2.15 L/sec
FEF 25-75 Pre: 2.11 L/sec
FEF2575-%Change-Post: 1 %
FEF2575-%PRED-POST: 67 %
FEF2575-%Pred-Pre: 65 %
FEV1-%Change-Post: 2 %
FEV1-%Pred-Post: 76 %
FEV1-%Pred-Pre: 74 %
FEV1-Post: 2.48 L
FEV1-Pre: 2.43 L
FEV1FVC-%CHANGE-POST: 4 %
FEV1FVC-%Pred-Pre: 86 %
FEV6-%Change-Post: 0 %
FEV6-%Pred-Post: 84 %
FEV6-%Pred-Pre: 84 %
FEV6-Post: 3.34 L
FEV6-Pre: 3.33 L
FEV6FVC-%Pred-Post: 102 %
FEV6FVC-%Pred-Pre: 102 %
FVC-%Change-Post: -2 %
FVC-%Pred-Post: 83 %
FVC-%Pred-Pre: 85 %
FVC-Post: 3.35 L
FVC-Pre: 3.44 L
Post FEV1/FVC ratio: 74 %
Post FEV6/FVC ratio: 100 %
Pre FEV1/FVC ratio: 71 %
Pre FEV6/FVC Ratio: 100 %
RV % pred: 124 %
RV: 2.24 L
TLC % pred: 104 %
TLC: 5.76 L

## 2018-01-05 LAB — CBC
HCT: 41.7 % (ref 36.0–46.0)
Hemoglobin: 12.7 g/dL (ref 12.0–15.0)
MCH: 29.7 pg (ref 26.0–34.0)
MCHC: 30.5 g/dL (ref 30.0–36.0)
MCV: 97.7 fL (ref 80.0–100.0)
PLATELETS: 237 10*3/uL (ref 150–400)
RBC: 4.27 MIL/uL (ref 3.87–5.11)
RDW: 12.7 % (ref 11.5–15.5)
WBC: 8.7 10*3/uL (ref 4.0–10.5)
nRBC: 0 % (ref 0.0–0.2)

## 2018-01-05 MED ORDER — ALBUTEROL SULFATE (2.5 MG/3ML) 0.083% IN NEBU
2.5000 mg | INHALATION_SOLUTION | Freq: Once | RESPIRATORY_TRACT | Status: AC
  Administered 2018-01-05: 2.5 mg via RESPIRATORY_TRACT

## 2018-01-06 LAB — MISC LABCORP TEST (SEND OUT): Labcorp test code: 163030

## 2018-01-07 LAB — MISC LABCORP TEST (SEND OUT)
Labcorp test code: 183805
Labcorp test code: 164319

## 2018-01-07 LAB — IGE: IgE (Immunoglobulin E), Serum: 15 IU/mL (ref 6–495)

## 2018-01-25 ENCOUNTER — Ambulatory Visit: Payer: BLUE CROSS/BLUE SHIELD

## 2018-01-31 DIAGNOSIS — I341 Nonrheumatic mitral (valve) prolapse: Secondary | ICD-10-CM | POA: Diagnosis not present

## 2018-01-31 DIAGNOSIS — J4540 Moderate persistent asthma, uncomplicated: Secondary | ICD-10-CM | POA: Diagnosis not present

## 2018-02-01 ENCOUNTER — Other Ambulatory Visit (HOSPITAL_COMMUNITY): Payer: Self-pay | Admitting: Pulmonary Disease

## 2018-02-01 DIAGNOSIS — R0602 Shortness of breath: Secondary | ICD-10-CM

## 2018-02-02 ENCOUNTER — Ambulatory Visit: Payer: BLUE CROSS/BLUE SHIELD

## 2018-02-02 ENCOUNTER — Ambulatory Visit: Payer: 59 | Admitting: Gynecology

## 2018-02-07 ENCOUNTER — Ambulatory Visit (HOSPITAL_COMMUNITY)
Admission: RE | Admit: 2018-02-07 | Discharge: 2018-02-07 | Disposition: A | Discharge status: Home / Self Care | Payer: 59 | Source: Ambulatory Visit | Attending: Family Medicine | Admitting: Family Medicine

## 2018-02-07 DIAGNOSIS — R0602 Shortness of breath: Secondary | ICD-10-CM | POA: Diagnosis not present

## 2018-02-07 NOTE — Progress Notes (Signed)
*  PRELIMINARY RESULTS* Echocardiogram 2D Echocardiogram has been performed.  Samuel Germany 02/07/2018, 2:34 PM

## 2018-02-25 DIAGNOSIS — G43909 Migraine, unspecified, not intractable, without status migrainosus: Secondary | ICD-10-CM | POA: Diagnosis not present

## 2018-02-25 DIAGNOSIS — R5383 Other fatigue: Secondary | ICD-10-CM | POA: Diagnosis not present

## 2018-02-25 DIAGNOSIS — R0602 Shortness of breath: Secondary | ICD-10-CM | POA: Diagnosis not present

## 2018-02-25 DIAGNOSIS — R51 Headache: Secondary | ICD-10-CM | POA: Diagnosis not present

## 2018-03-01 ENCOUNTER — Other Ambulatory Visit: Payer: Self-pay | Admitting: General Practice

## 2018-03-01 ENCOUNTER — Other Ambulatory Visit (HOSPITAL_COMMUNITY): Payer: Self-pay | Admitting: General Practice

## 2018-03-01 DIAGNOSIS — R0602 Shortness of breath: Secondary | ICD-10-CM

## 2018-03-03 ENCOUNTER — Ambulatory Visit
Admission: RE | Admit: 2018-03-03 | Discharge: 2018-03-03 | Disposition: A | Discharge status: Home / Self Care | Payer: 59 | Source: Ambulatory Visit | Attending: General Practice | Admitting: General Practice

## 2018-03-03 DIAGNOSIS — Z1231 Encounter for screening mammogram for malignant neoplasm of breast: Secondary | ICD-10-CM | POA: Diagnosis not present

## 2018-03-10 ENCOUNTER — Ambulatory Visit (HOSPITAL_COMMUNITY): Payer: 59

## 2018-03-10 ENCOUNTER — Ambulatory Visit: Payer: 59 | Admitting: Gynecology

## 2018-03-10 ENCOUNTER — Ambulatory Visit: Payer: 59

## 2018-03-10 DIAGNOSIS — Z0289 Encounter for other administrative examinations: Secondary | ICD-10-CM

## 2018-03-17 ENCOUNTER — Ambulatory Visit (HOSPITAL_COMMUNITY)
Admission: RE | Admit: 2018-03-17 | Discharge: 2018-03-17 | Disposition: A | Discharge status: Home / Self Care | Payer: 59 | Source: Ambulatory Visit | Attending: General Practice | Admitting: General Practice

## 2018-03-17 DIAGNOSIS — R0602 Shortness of breath: Secondary | ICD-10-CM | POA: Insufficient documentation

## 2018-03-17 DIAGNOSIS — R062 Wheezing: Secondary | ICD-10-CM | POA: Diagnosis not present

## 2018-03-23 ENCOUNTER — Other Ambulatory Visit (HOSPITAL_COMMUNITY): Payer: Self-pay | Admitting: General Practice

## 2018-03-23 ENCOUNTER — Other Ambulatory Visit: Payer: Self-pay | Admitting: General Practice

## 2018-03-23 DIAGNOSIS — R932 Abnormal findings on diagnostic imaging of liver and biliary tract: Secondary | ICD-10-CM

## 2018-03-25 ENCOUNTER — Encounter: Payer: Self-pay | Admitting: Pulmonary Disease

## 2018-03-25 ENCOUNTER — Ambulatory Visit (INDEPENDENT_AMBULATORY_CARE_PROVIDER_SITE_OTHER): Payer: 59 | Admitting: Pulmonary Disease

## 2018-03-25 DIAGNOSIS — M4804 Spinal stenosis, thoracic region: Secondary | ICD-10-CM

## 2018-03-25 DIAGNOSIS — J45998 Other asthma: Secondary | ICD-10-CM | POA: Diagnosis not present

## 2018-03-25 MED ORDER — BUDESONIDE-FORMOTEROL FUMARATE 160-4.5 MCG/ACT IN AERO: 2.0000 | INHALATION_SPRAY | Freq: Two times a day (BID) | RESPIRATORY_TRACT | 4 refills | Status: DC

## 2018-03-25 NOTE — Progress Notes (Signed)
Subjective:    Patient ID: Laurie Thompson, female    DOB: [birth year], 43 y.o.   MRN: [id number]  HPI  Chief Complaint  Patient presents with  . Pulm Consult    Referred by Dr. Quintin Alto for SOB with exertion and wheezing. Possible asthma. Was diagnosed with whooping cough back in December 43.      43 year old never smoker presents for evaluation of dyspnea on exertion for more than a year. She reports onset of shortness of breath with wheezing in 09/2016, she first sought evaluation in 02/2017 and was diagnosed with asthma, initially put on Breo and Ventolin with prednisone taper this did not cause much improvement and she had multiple office visits in 05-07-17.  She was evaluated by pulmonologist Dr. Luan Pulling in 11/2017.  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  She was told that she had asthma and is now maintained on a regimen of Symbicort.  She does not feel that this helps much.  Albuterol causes tachycardia and palpitations.  She reports intermittent wheezing which is episodic, she also reports dyspnea on exertion on sometimes on routine activities but again this is episodic and seems to fluctuate, no clear triggers identified such as weather changes.  She denies postnasal drip, significant allergies for heartburn requiring medications.  She lives with her husband and children. 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 Her dad was a smoker and had a lung transplant for asbestosis and died of cancer in 1998/05/08 44 years  Significant tests/ events reviewed  PFTs 12/2017 no evidence of airway obstruction, ratio 71, FEV1 74%, FVC 85%, no bronchodilator response, TLC 104%, DLCO 88% Flattening of inspiratory and expiratory loop  HRCT chest-03/18/18 -no evidence of  ILD  Echocardiogram normal EF 12/2017 IgE 15  Past Medical History:  Diagnosis Date  . Abnormal uterine bleeding (AUB) 05/22/2013  . Fecal occult blood test positive 01/29/2015  . Mitral valve prolapse    had echo 20 yrs ago no meds no prophylatic antibiotics  . Persistent headaches   . PONV (postoperative nausea and vomiting)   . Stress 01/29/2015   Past Surgical History:  Procedure Laterality Date  . BREAST REDUCTION SURGERY  10/06/2011   Procedure: MAMMARY REDUCTION  (BREAST);  Surgeon: Crissie Reese, MD;  Location: [facility];  Service: Plastics;  Laterality: Bilateral;  . CHOLECYSTECTOMY  02/15/2006   lap. chole.  Marland Kitchen DILATION AND CURETTAGE OF UTERUS    . DILITATION & CURRETTAGE/HYSTROSCOPY WITH THERMACHOICE ABLATION N/A 06/07/2013   Procedure: DILATATION & CURETTAGE/HYSTEROSCOPY WITH THERMACHOICE ABLATION;  Surgeon: [name], MD;  Location: [facility];  Service: Gynecology;  Laterality: N/A;  total time 12 minutes; 68-88 degrees celcius; 28ml of D5W in and 63ml of D5W out  . FOOT SURGERY  02/27/2009   percutaneous microfasciotomy left foot  . GASTROCNEMIUS RECESSION  05/01/2009   left gastroc. slide  . PLANTAR FASCIA RELEASE  02-2009  . WISDOM TOOTH EXTRACTION      Allergies  Allergen Reactions  . Dilaudid [Hydromorphone Hcl] Anaphylaxis    Throat sweling  . Erythromycin     Severe GI side effects  . Hydrocodeine [Dihydrocodeine] Itching  . Hibiclens [Chlorhexidine Gluconate] Rash    Social History   Socioeconomic History  . Marital status: Married  Spouse name: Not on file  . Number of children: Not on file  . Years of education: Not on file  . Highest education level: Not on file  Occupational History  . Not on file  Social Needs  . Financial resource strain: Not on file  . Food insecurity:    Worry: Not on file    Inability: Not on file  . Transportation needs:    Medical: Not on file    Non-medical: Not on file  Tobacco Use  . Smoking status:  Never Smoker  . Smokeless tobacco: Never Used  Substance and Sexual Activity  . Alcohol use: No  . Drug use: No  . Sexual activity: Yes    Birth control/protection: Other-see comments    Comment: husband had vasectomy  Lifestyle  . Physical activity:    Days per week: Not on file    Minutes per session: Not on file  . Stress: Not on file  Relationships  . Social connections:    Talks on phone: Not on file    Gets together: Not on file    Attends religious service: Not on file    Active member of club or organization: Not on file    Attends meetings of clubs or organizations: Not on file    Relationship status: Not on file  . Intimate partner violence:    Fear of current or ex partner: Not on file    Emotionally abused: Not on file    Physically abused: Not on file    Forced sexual activity: Not on file  Other Topics Concern  . Not on file  Social History Narrative  . Not on file     Family History  Problem Relation Age of Onset  . Hyperlipidemia Mother   . Macular degeneration Mother   . Cancer Father        lung cancer  . COPD Father   . Lung disease Father   . Heart disease Maternal Grandmother   . Lung disease Maternal Grandmother   . Hypertension Sister   . Fibromyalgia Sister   . GER disease Brother   . Cancer Paternal Aunt        cervical or ovarian   . Heart disease Maternal Grandfather      Review of Systems  Constitutional: Negative for fever and unexpected weight change.  HENT: Positive for ear pain. Negative for congestion, dental problem, nosebleeds, postnasal drip, rhinorrhea, sinus pressure, sneezing, sore throat and trouble swallowing.   Eyes: Negative for redness and itching.  Respiratory: Positive for cough and shortness of breath. Negative for chest tightness and wheezing.   Cardiovascular: Negative for palpitations and leg swelling.  Gastrointestinal: Negative for nausea and vomiting.  Genitourinary: Negative for dysuria.   Musculoskeletal: Negative for joint swelling.  Skin: Negative for rash.  Allergic/Immunologic: Negative.  Negative for environmental allergies, food allergies and immunocompromised state.  Neurological: Positive for headaches.  Hematological: Does not bruise/bleed easily.  Psychiatric/Behavioral: Negative for dysphoric mood. The patient is not nervous/anxious.        Objective:   Physical Exam  Gen. Pleasant, obese, in no distress, normal affect ENT - no pallor,icterus, no post nasal drip, class 2-3 airway Neck: No JVD, no thyromegaly, no carotid bruits Lungs: no use of accessory muscles, no dullness to percussion, decreased without rales or rhonchi  Cardiovascular: Rhythm regular, heart sounds  normal, no murmurs or gallops, no peripheral edema Abdomen: soft and non-tender, no hepatosplenomegaly, BS normal. Musculoskeletal: No deformities, no  cyanosis or clubbing Neuro:  alert, non focal, no tremors       Assessment & Plan:

## 2018-03-25 NOTE — Assessment & Plan Note (Signed)
Not convinced this is true asthma. We will look for to other conditions that can mimic asthma-tracheal stenosis and vocal cord dysfunction. There is flattening of expiratory and inspiratory loop suggesting fixed extrathoracic obstruction  ENT evaluation. CT neck without contrast. Refills on Symbicort, rinse mouth after use She will use albuterol on an as-needed basis

## 2018-03-25 NOTE — Patient Instructions (Signed)
Not convinced this is true asthma. We will look for to other conditions that can mimic asthma-tracheal stenosis and vocal cord dysfunction.  ENT evaluation. CT neck without contrast. Refills on Symbicort, rinse mouth after use

## 2018-03-30 ENCOUNTER — Ambulatory Visit (HOSPITAL_COMMUNITY): Admission: RE | Admit: 2018-03-30 | Payer: 59 | Source: Ambulatory Visit

## 2018-03-30 ENCOUNTER — Encounter (HOSPITAL_COMMUNITY): Payer: Self-pay

## 2018-03-31 ENCOUNTER — Ambulatory Visit (HOSPITAL_COMMUNITY)
Admission: RE | Admit: 2018-03-31 | Discharge: 2018-03-31 | Disposition: A | Discharge status: Home / Self Care | Payer: 59 | Source: Ambulatory Visit | Attending: Pulmonary Disease | Admitting: Pulmonary Disease

## 2018-03-31 ENCOUNTER — Other Ambulatory Visit: Payer: Self-pay

## 2018-03-31 DIAGNOSIS — M4804 Spinal stenosis, thoracic region: Secondary | ICD-10-CM | POA: Diagnosis not present

## 2018-03-31 DIAGNOSIS — J398 Other specified diseases of upper respiratory tract: Secondary | ICD-10-CM | POA: Diagnosis not present

## 2018-04-04 ENCOUNTER — Telehealth: Payer: Self-pay | Admitting: Pulmonary Disease

## 2018-04-04 NOTE — Telephone Encounter (Signed)
Notes recorded by Valerie Salts, CMA on 04/01/2018 at 4:52 PM EDT ATC, call went straight to VM. Left message for patient to call back for results. ------  Notes recorded by Rigoberto Noel, MD on 04/01/2018 at 4:40 PM EDT CT scan showed soft tissue thickening just below the vocal cords and narrowing of the trachea which is likely causing her symptoms. I will refer her to ENT-Dr. Erik Obey or Dr. Redmond Baseman for further evaluation  Spoke with patient. She is aware of the above results. She wants to know if she should continue seeing Dr. Benjamine Mola (ENT) instead of switching to a new ENT.   RA, please advise. Thanks!

## 2018-04-08 NOTE — Telephone Encounter (Signed)
Unable to reach LMTCB 

## 2018-04-08 NOTE — Telephone Encounter (Signed)
Certainly okay to go to Dr. Benjamine Mola Please send CAT scan results to him

## 2018-04-11 ENCOUNTER — Telehealth: Payer: Self-pay | Admitting: Primary Care

## 2018-04-11 DIAGNOSIS — Z20828 Contact with and (suspected) exposure to other viral communicable diseases: Secondary | ICD-10-CM | POA: Diagnosis not present

## 2018-04-11 NOTE — Telephone Encounter (Signed)
I have a message out to Dr. Elsworth Soho regarding her results. I would call [phone] to be screening for COVID testing. 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 We recommend you be free from fever for 3 straight days (check temp every 4 hours) and free from respiratory symptoms x7 days. Encourage fluids, she can take tylenol for fever.

## 2018-04-11 NOTE — Telephone Encounter (Signed)
Patient returning phone call.  Patient phone number is [phone].

## 2018-04-11 NOTE — Telephone Encounter (Signed)
This was an APP of the day phone call, patient asking for her CT results and also let us know that she has been feeling short of breath, has a dry cough and running low grade fever. 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   CT neck showed lobulated soft tissue in the subglottic trachea internal to the cricoid ring is abnormal but nonspecific. And though chronic to a degree, has substantially increased since 2013. Direct visualization and biopsy likely would be most valuable  She has an apt set up with Dr. Benjamine Mola, I dont know the date. I'm happy to call her but need to know what you recommend in terms of her imaging because I have not seen her  Much appreciated, [facility]

## 2018-04-11 NOTE — Telephone Encounter (Signed)
Primary Pulmonologist: Elsworth Soho Last office visit and with whom: 03/25/2018 with RA What do we see them for (pulmonary problems): asthma Last OV assessment/plan:  Instructions   Return in about 1 month (around 04/25/2018).  Not convinced this is true asthma. We will look for to other conditions that can mimic asthma-tracheal stenosis and vocal cord dysfunction.  ENT evaluation. CT neck without contrast. Refills on Symbicort, rinse mouth after use       Was appointment offered to patient (explain)?  Pt wants to know recommendations   Reason for call: Called and spoke with pt stating to her that she could be referred to Dr. Benjamine Mola and pt stated that she does have an appt already set up.  While speaking with pt, pt stated to me that has been running temps x1 week ranging between 99-100.4.  Pt also has had a dry cough which she thinks is due to allergies but states she has had that x4 nights. Pt states she has had SOB but states she is always SOB which is not a new symptom for her.  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  Beth, please advise recs for pt. Thanks!

## 2018-04-11 NOTE — Telephone Encounter (Signed)
Called and spoke with pt stating to her the info provided by Derl Barrow, NP and provided her the phone number for her to call to be screened for COVID-19.  Pt expressed understanding and asked me if I could fax the info from today's phone conversation to her work for her.  I stated to pt that I could do that and was provided the fax number and name of person that this needed to be sent to.  Also stated to pt after she calls the number to do the screening for COVID-19 to inform her work about what she found out.  Pt expressed understanding.  Info has been faxed to pt's work for her. Nothing further needed.

## 2018-04-12 NOTE — Telephone Encounter (Signed)
Left VM for patient to return call to review CT neck results and Dr. Bari Mantis recommendations

## 2018-04-12 NOTE — Telephone Encounter (Signed)
Called and spoke with patient regarding RA recommendations below Pt states that her ov with RA on 04/28/18 needs cancelled She has scheduled a consult with ENT for 04/28/18 Nothing further needed

## 2018-04-12 NOTE — Telephone Encounter (Signed)
Please let her know results of CT scan-no more imaging required. ENT consult for endoscopy and likely biopsy.  I have discussed with [facility] and they felt that this would be more of an ENT issue

## 2018-04-12 NOTE — Telephone Encounter (Signed)
Pt returning call regarding results - CB# [phone]

## 2018-04-12 NOTE — Telephone Encounter (Signed)
Thanks so much, I will call her today

## 2018-04-18 DIAGNOSIS — Z139 Encounter for screening, unspecified: Secondary | ICD-10-CM | POA: Diagnosis not present

## 2018-04-28 ENCOUNTER — Ambulatory Visit: Payer: 59 | Admitting: Pulmonary Disease

## 2018-04-28 DIAGNOSIS — R42 Dizziness and giddiness: Secondary | ICD-10-CM | POA: Diagnosis not present

## 2018-04-28 DIAGNOSIS — S134XXA Sprain of ligaments of cervical spine, initial encounter: Secondary | ICD-10-CM | POA: Diagnosis not present

## 2018-06-02 ENCOUNTER — Other Ambulatory Visit: Payer: Self-pay

## 2018-06-02 ENCOUNTER — Ambulatory Visit (INDEPENDENT_AMBULATORY_CARE_PROVIDER_SITE_OTHER): Payer: 59 | Admitting: Otolaryngology

## 2018-06-02 DIAGNOSIS — R0602 Shortness of breath: Secondary | ICD-10-CM | POA: Diagnosis not present

## 2018-06-02 DIAGNOSIS — D380 Neoplasm of uncertain behavior of larynx: Secondary | ICD-10-CM

## 2018-06-02 DIAGNOSIS — R42 Dizziness and giddiness: Secondary | ICD-10-CM

## 2018-06-09 ENCOUNTER — Other Ambulatory Visit: Payer: Self-pay | Admitting: Otolaryngology

## 2018-10-11 ENCOUNTER — Encounter: Payer: Self-pay | Admitting: Gynecology

## 2018-10-24 ENCOUNTER — Other Ambulatory Visit (HOSPITAL_COMMUNITY)
Admission: RE | Admit: 2018-10-24 | Discharge: 2018-10-24 | Disposition: A | Discharge status: Home / Self Care | Payer: 59 | Source: Ambulatory Visit | Attending: Otolaryngology | Admitting: Otolaryngology

## 2018-10-24 ENCOUNTER — Encounter (HOSPITAL_COMMUNITY): Payer: Self-pay | Admitting: *Deleted

## 2018-10-24 ENCOUNTER — Other Ambulatory Visit: Payer: Self-pay

## 2018-10-24 DIAGNOSIS — Z01812 Encounter for preprocedural laboratory examination: Secondary | ICD-10-CM | POA: Diagnosis present

## 2018-10-24 DIAGNOSIS — Z20828 Contact with and (suspected) exposure to other viral communicable diseases: Secondary | ICD-10-CM | POA: Insufficient documentation

## 2018-10-24 LAB — SARS CORONAVIRUS 2 (TAT 6-24 HRS): SARS Coronavirus 2: NEGATIVE

## 2018-10-24 NOTE — Progress Notes (Signed)
Pt denies chest pain and being under the care of a cardiologist. Pt stated that she gets SOB " that is what started this. "  Pt denies having a stress test and cardiac cath. Pt denies recent labs. Nurse requested LOV note and EKG from pt PCP, Dr. Gar Ponto at [facility]; awaiting response. Pt made aware to stop  taking Aspirin (unless otherwise advised by surgeon), vitamins, fish oil and herbal medications. Do not take any NSAIDs ie: Ibuprofen, Advil, Naproxen (Aleve), Motrin, BC and Goody Powder. Pt verbalized understanding of all pre-op instructions.

## 2018-10-26 ENCOUNTER — Encounter (HOSPITAL_COMMUNITY): Payer: Self-pay | Admitting: Certified Registered"

## 2018-10-26 ENCOUNTER — Encounter (HOSPITAL_COMMUNITY)
Admission: RE | Disposition: A | Discharge status: Home / Self Care | Payer: Self-pay | Source: Home / Self Care | Attending: Otolaryngology

## 2018-10-26 ENCOUNTER — Ambulatory Visit (HOSPITAL_COMMUNITY): Payer: 59 | Admitting: Certified Registered"

## 2018-10-26 ENCOUNTER — Ambulatory Visit (HOSPITAL_COMMUNITY)
Admission: RE | Admit: 2018-10-26 | Discharge: 2018-10-26 | Disposition: A | Discharge status: Home / Self Care | Payer: 59 | Attending: Otolaryngology | Admitting: Otolaryngology

## 2018-10-26 ENCOUNTER — Other Ambulatory Visit: Payer: Self-pay

## 2018-10-26 DIAGNOSIS — Z885 Allergy status to narcotic agent status: Secondary | ICD-10-CM | POA: Insufficient documentation

## 2018-10-26 DIAGNOSIS — R42 Dizziness and giddiness: Secondary | ICD-10-CM | POA: Insufficient documentation

## 2018-10-26 DIAGNOSIS — J45909 Unspecified asthma, uncomplicated: Secondary | ICD-10-CM | POA: Diagnosis not present

## 2018-10-26 DIAGNOSIS — J386 Stenosis of larynx: Secondary | ICD-10-CM | POA: Diagnosis not present

## 2018-10-26 DIAGNOSIS — Z881 Allergy status to other antibiotic agents status: Secondary | ICD-10-CM | POA: Diagnosis not present

## 2018-10-26 HISTORY — PX: LARYNGOSCOPY AND BRONCHOSCOPY: SHX5659

## 2018-10-26 HISTORY — DX: Stenosis of larynx: J38.6

## 2018-10-26 LAB — CBC
HCT: 39.3 % (ref 36.0–46.0)
Hemoglobin: 12.7 g/dL (ref 12.0–15.0)
MCH: 30.9 pg (ref 26.0–34.0)
MCHC: 32.3 g/dL (ref 30.0–36.0)
MCV: 95.6 fL (ref 80.0–100.0)
Platelets: 234 10*3/uL (ref 150–400)
RBC: 4.11 MIL/uL (ref 3.87–5.11)
RDW: 12.9 % (ref 11.5–15.5)
WBC: 8 10*3/uL (ref 4.0–10.5)
nRBC: 0 % (ref 0.0–0.2)

## 2018-10-26 LAB — BASIC METABOLIC PANEL
Anion gap: 11 (ref 5–15)
BUN: 9 mg/dL (ref 6–20)
CO2: 23 mmol/L (ref 22–32)
Calcium: 8.8 mg/dL — ABNORMAL LOW (ref 8.9–10.3)
Chloride: 104 mmol/L (ref 98–111)
Creatinine, Ser: 0.66 mg/dL (ref 0.44–1.00)
GFR calc Af Amer: >60 mL/min (ref >60)
GFR calc non Af Amer: >60 mL/min (ref >60)
Glucose, Bld: 112 mg/dL — ABNORMAL HIGH (ref 70–99)
Potassium: 3.8 mmol/L (ref 3.5–5.1)
Sodium: 138 mmol/L (ref 135–145)

## 2018-10-26 LAB — POCT PREGNANCY, URINE: Preg Test, Ur: NEGATIVE

## 2018-10-26 SURGERY — LARYNGOSCOPY, WITH BRONCHOSCOPY
Anesthesia: General

## 2018-10-26 MED ORDER — FENTANYL CITRATE (PF) 100 MCG/2ML IJ SOLN: 25.0000 ug | INTRAMUSCULAR | Status: DC | PRN

## 2018-10-26 MED ORDER — MIDAZOLAM HCL 2 MG/2ML IJ SOLN: 0.5000 mg | Freq: Once | INTRAMUSCULAR | Status: DC | PRN

## 2018-10-26 MED ORDER — FENTANYL CITRATE (PF) 100 MCG/2ML IJ SOLN
INTRAMUSCULAR | Status: DC | PRN
  Administered 2018-10-26: 50 ug via INTRAVENOUS
  Administered 2018-10-26: 100 ug via INTRAVENOUS

## 2018-10-26 MED ORDER — CEFAZOLIN SODIUM-DEXTROSE 2-4 GM/100ML-% IV SOLN
INTRAVENOUS | Status: AC
  Filled 2018-10-26: qty 100

## 2018-10-26 MED ORDER — SUCCINYLCHOLINE CHLORIDE 200 MG/10ML IV SOSY
PREFILLED_SYRINGE | INTRAVENOUS | Status: AC
  Filled 2018-10-26: qty 10

## 2018-10-26 MED ORDER — ONDANSETRON HCL 4 MG/2ML IJ SOLN
INTRAMUSCULAR | Status: DC | PRN
  Administered 2018-10-26: 4 mg via INTRAVENOUS

## 2018-10-26 MED ORDER — FENTANYL CITRATE (PF) 250 MCG/5ML IJ SOLN
INTRAMUSCULAR | Status: AC
  Filled 2018-10-26: qty 5

## 2018-10-26 MED ORDER — CEFAZOLIN SODIUM-DEXTROSE 2-3 GM-%(50ML) IV SOLR
INTRAVENOUS | Status: DC | PRN
  Administered 2018-10-26: 2 g via INTRAVENOUS

## 2018-10-26 MED ORDER — EPINEPHRINE PF 1 MG/ML IJ SOLN
INTRAMUSCULAR | Status: AC
  Filled 2018-10-26: qty 1

## 2018-10-26 MED ORDER — DEXAMETHASONE SODIUM PHOSPHATE 10 MG/ML IJ SOLN
INTRAMUSCULAR | Status: DC | PRN
  Administered 2018-10-26: 10 mg via INTRAVENOUS

## 2018-10-26 MED ORDER — ROCURONIUM BROMIDE 10 MG/ML (PF) SYRINGE
PREFILLED_SYRINGE | INTRAVENOUS | Status: AC
  Filled 2018-10-26: qty 10

## 2018-10-26 MED ORDER — MIDAZOLAM HCL 2 MG/2ML IJ SOLN
INTRAMUSCULAR | Status: AC
  Filled 2018-10-26: qty 2

## 2018-10-26 MED ORDER — ROCURONIUM BROMIDE 10 MG/ML (PF) SYRINGE
PREFILLED_SYRINGE | INTRAVENOUS | Status: DC | PRN
  Administered 2018-10-26: 50 mg via INTRAVENOUS

## 2018-10-26 MED ORDER — PROPOFOL 10 MG/ML IV BOLUS
INTRAVENOUS | Status: DC | PRN
  Administered 2018-10-26: 150 mg via INTRAVENOUS

## 2018-10-26 MED ORDER — SUGAMMADEX SODIUM 200 MG/2ML IV SOLN
INTRAVENOUS | Status: DC | PRN
  Administered 2018-10-26: 400 mg via INTRAVENOUS

## 2018-10-26 MED ORDER — LIDOCAINE 2% (20 MG/ML) 5 ML SYRINGE
INTRAMUSCULAR | Status: AC
  Filled 2018-10-26: qty 5

## 2018-10-26 MED ORDER — PROMETHAZINE HCL 25 MG/ML IJ SOLN: 6.2500 mg | INTRAMUSCULAR | Status: DC | PRN

## 2018-10-26 MED ORDER — LACTATED RINGERS IV SOLN
INTRAVENOUS | Status: DC | PRN
  Administered 2018-10-26: 08:00:00 via INTRAVENOUS

## 2018-10-26 MED ORDER — PROPOFOL 10 MG/ML IV BOLUS
INTRAVENOUS | Status: AC
  Filled 2018-10-26: qty 20

## 2018-10-26 MED ORDER — LIDOCAINE 2% (20 MG/ML) 5 ML SYRINGE
INTRAMUSCULAR | Status: DC | PRN
  Administered 2018-10-26: 25 mg via INTRAVENOUS

## 2018-10-26 MED ORDER — ONDANSETRON HCL 4 MG/2ML IJ SOLN
INTRAMUSCULAR | Status: AC
  Filled 2018-10-26: qty 2

## 2018-10-26 MED ORDER — DEXAMETHASONE SODIUM PHOSPHATE 10 MG/ML IJ SOLN
INTRAMUSCULAR | Status: AC
  Filled 2018-10-26: qty 1

## 2018-10-26 MED ORDER — MIDAZOLAM HCL 5 MG/5ML IJ SOLN
INTRAMUSCULAR | Status: DC | PRN
  Administered 2018-10-26: 2 mg via INTRAVENOUS

## 2018-10-26 MED ORDER — MEPERIDINE HCL 25 MG/ML IJ SOLN: 6.2500 mg | INTRAMUSCULAR | Status: DC | PRN

## 2018-10-26 MED ORDER — OXYMETAZOLINE HCL 0.05 % NA SOLN
NASAL | Status: AC
  Filled 2018-10-26: qty 30

## 2018-10-26 SURGICAL SUPPLY — 24 items
BLADE SURG 15 STRL LF DISP TIS (BLADE) IMPLANT
BLADE SURG 15 STRL SS (BLADE)
CANISTER SUCT 3000ML PPV (MISCELLANEOUS) ×2 IMPLANT
CONT SPEC 4OZ CLIKSEAL STRL BL (MISCELLANEOUS) IMPLANT
COVER BACK TABLE 60X90IN (DRAPES) ×2 IMPLANT
COVER WAND RF STERILE (DRAPES) IMPLANT
DRAPE HALF SHEET 40X57 (DRAPES) ×2 IMPLANT
GAUZE SPONGE 4X4 12PLY STRL (GAUZE/BANDAGES/DRESSINGS) ×2 IMPLANT
GLOVE BIO SURGEON STRL SZ7.5 (GLOVE) ×2 IMPLANT
GOWN STRL REUS W/ TWL LRG LVL3 (GOWN DISPOSABLE) ×2 IMPLANT
GOWN STRL REUS W/TWL LRG LVL3 (GOWN DISPOSABLE) ×2
GUARD TEETH (MISCELLANEOUS) IMPLANT
KIT BASIN OR (CUSTOM PROCEDURE TRAY) ×2 IMPLANT
KIT TURNOVER KIT B (KITS) ×2 IMPLANT
NEEDLE HYPO 25GX1X1/2 BEV (NEEDLE) IMPLANT
NS IRRIG 1000ML POUR BTL (IV SOLUTION) ×2 IMPLANT
PAD ARMBOARD 7.5X6 YLW CONV (MISCELLANEOUS) ×4 IMPLANT
PATTIES SURGICAL .5 X1 (DISPOSABLE) IMPLANT
SOL ANTI FOG 6CC (MISCELLANEOUS) ×1 IMPLANT
SOLUTION ANTI FOG 6CC (MISCELLANEOUS) ×1
SURGILUBE 2OZ TUBE FLIPTOP (MISCELLANEOUS) ×2 IMPLANT
TOWEL GREEN STERILE FF (TOWEL DISPOSABLE) ×4 IMPLANT
TUBE CONNECTING 12X1/4 (SUCTIONS) ×2 IMPLANT
WATER STERILE IRR 1000ML POUR (IV SOLUTION) ×2 IMPLANT

## 2018-10-26 NOTE — Transfer of Care (Signed)
Immediate Anesthesia Transfer of Care Note  Patient: Laurie Thompson  Procedure(s) Performed: LARYNGOSCOPY WITH BIOPSY AND BRONCHOSCOPY (N/A )  Patient Location: PACU  Anesthesia Type:General  Level of Consciousness: drowsy and patient cooperative  Airway & Oxygen Therapy: Patient Spontanous Breathing and Patient connected to nasal cannula oxygen  Post-op Assessment: Report given to RN, Post -op Vital signs reviewed and stable and Patient moving all extremities  Post vital signs: Reviewed and stable  Last Vitals:  Vitals Value Taken Time  BP 121/70 10/26/18 0912  Temp 36.9 C 10/26/18 0912  Pulse 105 10/26/18 0914  Resp 24 10/26/18 0914  SpO2 93 % 10/26/18 0914  Vitals shown include unvalidated device data.  Last Pain:  Vitals:   10/26/18 0912  TempSrc:   PainSc: 0-No pain      Patients Stated Pain Goal: 3 ([id number])  Complications: No apparent anesthesia complications

## 2018-10-26 NOTE — Anesthesia Preprocedure Evaluation (Addendum)
Anesthesia Evaluation  Patient identified by MRN, date of birth, ID band Patient awake    Reviewed: Allergy & Precautions, NPO status , Patient's Chart, lab work & pertinent test results  History of Anesthesia Complications (+) PONV  Airway Mallampati: II  TM Distance: >3 FB Neck ROM: Full    Dental  (+) Teeth Intact, Dental Advisory Given   Pulmonary neg pulmonary ROS,  10/24/2018 SARS Coronavirus NEG   Pulmonary exam normal breath sounds clear to auscultation       Cardiovascular negative cardio ROS   Rhythm:Regular Rate:Normal  01/2018 ECHO: EF 60-65%, valves OK   Neuro/Psych negative neurological ROS     GI/Hepatic negative GI ROS, Neg liver ROS,   Endo/Other  negative endocrine ROS  Renal/GU negative Renal ROS     Musculoskeletal   Abdominal   Peds  Hematology negative hematology ROS (+)   Anesthesia Other Findings   Reproductive/Obstetrics                            Anesthesia Physical Anesthesia Plan  ASA: II  Anesthesia Plan: General   Post-op Pain Management:    Induction: Intravenous  PONV Risk Score and Plan: 4 or greater and Ondansetron, Dexamethasone, Midazolam and Scopolamine patch - Pre-op  Airway Management Planned: Oral ETT  Additional Equipment:   Intra-op Plan:   Post-operative Plan: Extubation in OR  Informed Consent: I have reviewed the patients History and Physical, chart, labs and discussed the procedure including the risks, benefits and alternatives for the proposed anesthesia with the patient or authorized representative who has indicated his/her understanding and acceptance.     Dental advisory given  Plan Discussed with: CRNA and Surgeon  Anesthesia Plan Comments:        Anesthesia Quick Evaluation

## 2018-10-26 NOTE — Anesthesia Procedure Notes (Signed)
Procedure Name: Intubation Date/Time: 10/26/2018 8:42 AM Performed by: Moshe Salisbury, CRNA Pre-anesthesia Checklist: Patient identified, Emergency Drugs available, Suction available and Patient being monitored Patient Re-evaluated:Patient Re-evaluated prior to induction Oxygen Delivery Method: Circle System Utilized Preoxygenation: Pre-oxygenation with 100% oxygen Induction Type: IV induction Ventilation: Mask ventilation without difficulty Laryngoscope Size: Mac and 3 Grade View: Grade I Tube type: Oral Tube size: 6.0 mm Number of attempts: 1 Airway Equipment and Method: Stylet Placement Confirmation: ETT inserted through vocal cords under direct vision,  positive ETCO2 and breath sounds checked- equal and bilateral Secured at: 21 cm Tube secured with: Tape Dental Injury: Teeth and Oropharynx as per pre-operative assessment

## 2018-10-26 NOTE — Anesthesia Postprocedure Evaluation (Signed)
Anesthesia Post Note  Patient: Laurie Thompson  Procedure(s) Performed: LARYNGOSCOPY WITH BIOPSY AND BRONCHOSCOPY (N/A )     Patient location during evaluation: PACU Anesthesia Type: General Level of consciousness: awake and alert, patient cooperative and oriented Pain management: pain level controlled Vital Signs Assessment: post-procedure vital signs reviewed and stable Respiratory status: spontaneous breathing, nonlabored ventilation and respiratory function stable Cardiovascular status: blood pressure returned to baseline and stable Postop Assessment: no apparent nausea or vomiting and adequate PO intake Anesthetic complications: no    Last Vitals:  Vitals:   10/26/18 0936 10/26/18 0938  BP: 124/67   Pulse: 97 92  Resp:    Temp: (!) 36.2 C   SpO2: 93% 94%    Last Pain:  Vitals:   10/26/18 0936  TempSrc:   PainSc: 0-No pain                 [name],E. Natalia Wittmeyer

## 2018-10-26 NOTE — Op Note (Signed)
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 Subglottic stenosis 2. Chronic cough  PROCEDURE PERFORMED:   1. MicroDirect laryngoscopy with biopsy 2. Flexible bronchoscopy  ANESTHESIA:  General endotracheal tube anesthesia.  COMPLICATIONS:  None.  ESTIMATED BLOOD LOSS:  Minimal.  INDICATION FOR PROCEDURE:  Laurie Thompson is a 43 y.o. female with a history of shortness of breath, chronic cough and wheezing for the past 2 years.  She was initially diagnosed as having reactive airway disease. She was treated with 2 different inhalers and systemic steroids.  However, her condition worsened over the past 2 years.  She was evaluated by Dr. Luan Pulling and by Dr. Kara Mead.  No significant abnormality was noted on her pulmonary function test and chest CT.  She recently underwent a neck CT scan.  The CT showed lobulated soft tissue in the subglottic tracheal region internal to the cricoid ring.  It narrowed the airway diameter circumferentially.  Direct visualization and biopsy were recommended.  Possible differentials include papillomatosis and granulation tissue.  Based on the above findings, the decision was made for the patient to undergo the above-stated procedures. Likelihood of success in reducing symptoms was also discussed.  The risks, benefits, alternatives, and details of the procedure were discussed with the patient.  Questions were invited and answered.  Informed consent was obtained.  DESCRIPTION:  The patient was taken to the operating room and placed supine on the operating table.  General endotracheal tube anesthesia was administered by the anesthesiologist.  The patient was positioned and prepped and draped in a standard fashion for direct laryngoscopy.  A Dedo laryngoscope was used for examination.  The laryngoscope was inserted via  the oral cavity into the pharynx.  Examinations of the epiglottis, aryepiglottic folds, vallecula, piriform sinuses, and the vocal cords were all normal.  No suspicious mass or lesion was noted.  The Dedo laryngoscope was suspended with the Lewy suspender.  The endotracheal tube was removed.  A rigid endoscope was then inserted through the Dedo laryngoscope into the subglottic region.  A rim of fibrotic soft tissue was noted to narrow the subglottic region.  No suspicious mass or lesion was noted.  Multiple biopsy specimens were obtained from the stenotic region.  The rigid endoscope was withdrawn.   A flexible bronchoscope was inserted via the Dedo laryngoscope into the trachea.  The bronchoscope was advanced to the level of the carina.  Normal tracheal mucosa was noted.  Examination of the mainstem bronchi and the segmental takeoffs were also normal.  No signs of infection or lesion was noted.  The flexible bronchoscope and the Dedo laryngoscope was withdrawn.  The care of the patient was turned over to the anesthesiologist.  The patient was awakened from anesthesia without difficulty.  The patient was extubated and transferred to the recovery room in good condition.  OPERATIVE FINDINGS: Circumferential subglottic stenosis, secondary to fibrotic soft tissue.  No fungating lesion or mass was noted.  No abnormality was noted on the bronchoscopy examination.  SPECIMEN: Biopsy specimens from the subglottic stenosis.  FOLLOWUP CARE:  The patient will be discharged home once awake and alert. The patient will follow up in my office in approximately 1 week.  Leighanne Adolph Cassie Freer 10/26/2018 9:04  AM

## 2018-10-26 NOTE — H&P (Signed)
Cc: Subglottic stenosis, chronic cough  HPI: The patient is a 43 year old female who presents today with multiple medical issues.  The patient is seen in consultation requested by [facility].  According to the patient, she has been experiencing shortness of breath, chronic cough and wheezing for the past 2 years.  She was initially diagnosed as having reactive airway disease. She was treated with 2 different inhalers and systemic steroids.  However, her condition worsened over the past 2 years.  She was evaluated by Dr. Luan Pulling and by Dr. Kara Mead.  No significant abnormality was noted on her pulmonary function test and chest CT.  She recently underwent a neck CT scan.  The CT showed lobulated soft tissue in the subglottic tracheal region internal to the cricoid ring.  It narrowed the airway diameter circumferentially.  Direct visualization and biopsy were recommended.  Possible differentials include papillomatosis and granulation tissue.  In addition, the patient also complains of recurrent dizziness since 02/2018.  Her dizziness is described as a spinning sensation that lasts from several seconds to several minutes. She typically has an episode every 7 to 10 days.  She denies any otalgia, otorrhea, or aural pressure.  She has no previous history of ENT/head and neck surgery.  Currently she is using meclizine on a prn basis.   The patient's review of systems (constitutional, eyes, ENT, cardiovascular, respiratory, GI, musculoskeletal, skin, neurologic, psychiatric, endocrine, hematologic, allergic) is noted in the ROS questionnaire.  It is reviewed with the patient.  Family health history: Heart disease.  Major events: Gallbladder removed, foot surgery, ablation of uterus, breast reduction.  Ongoing medical problems: Migraine, vertigo.  Social history: The patient is married. She denies the use of tobacco, alcohol or illegal drugs.    Exam General: Communicates without difficulty, well  nourished, no acute distress. Head: Normocephalic, no evidence injury, no tenderness, facial buttresses intact without stepoff. Face/sinus: No tenderness to palpation and percussion. Facial movement is normal and symmetric. Eyes: PERRL, EOMI. No scleral icterus, conjunctivae clear. Neuro: CN II exam reveals vision grossly intact.  No nystagmus at any point of gaze. Ears: Auricles well formed without lesions.  Ear canals are intact without mass or lesion.  No erythema or edema is appreciated.  The TMs are intact without fluid. Nose: External evaluation reveals normal support and skin without lesions.  Dorsum is intact.  Anterior rhinoscopy reveals pink mucosa over anterior aspect of inferior turbinates and intact septum.  No purulence noted. Oral:  Oral cavity and oropharynx are intact, symmetric, without erythema or edema.  Mucosa is moist without lesions. Neck: Full range of motion without pain.  There is no significant lymphadenopathy.  No masses palpable.  Thyroid bed within normal limits to palpation.  Parotid glands and submandibular glands equal bilaterally without mass.  Trachea is midline. Neuro:  CN 2-12 grossly intact. Gait normal. Vestibular: No nystagmus at any point of gaze. Dix Hallpike negative. The cerebellar examination is unremarkable. Vestibular: There is no nystagmus with pneumatic pressure on either tympanic membrane or Valsalva.   Procedure:  Flexible Fiberoptic Laryngoscopy Risks, benefits, and alternatives of flexible endoscopy were explained to the patient.  Specific mention was made of the risk of throat numbness with difficulty swallowing, possible bleeding from the nose and mouth, and pain from the procedure.  The patient gave oral consent to proceed.  The nasal cavities were decongested and anesthetised with a combination of oxymetazoline and 4% lidocaine solution.  The flexible scope was inserted into the right nasal cavity  and advanced towards the nasopharynx.  Visualized mucosa over  the turbinates and septum were as described above.  The nasopharynx was clear.  Oropharyngeal walls were symmetric and mobile without lesion, mass, or edema.  Hypopharynx was also without  lesion or edema.  Larynx was mobile without lesions. Supraglottic structures were free of edema, mass, and asymmetry.  True vocal folds were white without mass or lesion.  Base of tongue was within normal limits.   Assessment 1.  Chronic shortness of breath and wheezing of unknown etiology.  The patient's recent neck CT scan showed lobulated tissue in the subglottic/superior tracheal region.  The differential includes papillomatosis or granulomatous diseases.   2.  No suspicious mass or lesion is noted on today's laryngoscopy examination.  Her vocal cords are mobile bilaterally without any mucosal abnormality.  3.  Recurrent dizziness.  Her Dix-Hallpike maneuver is negative today.  Her dizziness may be multifactorial, with possible peripheral versus central vestibular pathology.   Plan  1.  The physical exam and laryngoscopy findings are reviewed with the patient.  2.  Based on the CT findings, the patient will likely benefit from undergoing laryngoscopy and bronchoscopy with possible biopsy of her subglottic soft tissue.  The risks, benefits, alternatives and details of the procedure are reviewed with the patient.  3.  The patient may benefit from neurodiagnostic vestibular testing to evaluate her vestibular pathology.  The patient would like to defer her vestibular evaluation until completion of her biopsy procedure.

## 2018-10-26 NOTE — Discharge Instructions (Signed)
The patient may resume all her previous activities and diet.  She will follow-up in my [place] office in 1 week.

## 2018-10-27 ENCOUNTER — Other Ambulatory Visit: Payer: Self-pay

## 2018-10-27 ENCOUNTER — Encounter (HOSPITAL_COMMUNITY): Payer: Self-pay | Admitting: Otolaryngology

## 2018-10-28 LAB — SURGICAL PATHOLOGY

## 2018-11-03 ENCOUNTER — Other Ambulatory Visit: Payer: Self-pay

## 2018-11-03 ENCOUNTER — Ambulatory Visit (INDEPENDENT_AMBULATORY_CARE_PROVIDER_SITE_OTHER): Payer: 59 | Admitting: Otolaryngology

## 2018-11-03 DIAGNOSIS — J386 Stenosis of larynx: Secondary | ICD-10-CM

## 2019-01-26 ENCOUNTER — Other Ambulatory Visit: Payer: Self-pay | Admitting: Obstetrics and Gynecology

## 2019-01-26 DIAGNOSIS — Z1231 Encounter for screening mammogram for malignant neoplasm of breast: Secondary | ICD-10-CM

## 2019-03-06 ENCOUNTER — Ambulatory Visit
Admission: RE | Admit: 2019-03-06 | Discharge: 2019-03-06 | Disposition: A | Discharge status: Home / Self Care | Payer: 59 | Source: Ambulatory Visit | Attending: Obstetrics and Gynecology | Admitting: Obstetrics and Gynecology

## 2019-03-06 ENCOUNTER — Other Ambulatory Visit: Payer: Self-pay

## 2019-03-06 DIAGNOSIS — Z1231 Encounter for screening mammogram for malignant neoplasm of breast: Secondary | ICD-10-CM

## 2019-05-15 ENCOUNTER — Other Ambulatory Visit: Payer: Self-pay

## 2019-05-15 ENCOUNTER — Ambulatory Visit
Admission: RE | Admit: 2019-05-15 | Discharge: 2019-05-15 | Disposition: A | Discharge status: Home / Self Care | Payer: 59 | Source: Ambulatory Visit | Attending: Obstetrics and Gynecology | Admitting: Obstetrics and Gynecology

## 2019-06-18 IMAGING — CT CT NECK WITHOUT CONTRAST
3 of 4 series · 12 of 35 positions shown, 14 images · non-contrast
Comparison: Cervical spine CT 10/14/2011.

CLINICAL DATA: 43-year-old diagnosed and treated for whooping cough
in December 2017. Query tracheal stenosis, vocal cord dysfunction.

EXAM:
CT NECK WITHOUT CONTRAST
TECHNIQUE: Multidetector CT imaging of the neck was performed following the
standard protocol without intravenous contrast.

[Series 6: coronal neck · coronal · 0.50mm/px · 3 of 133 slices shown]
[im 39/133  bone]
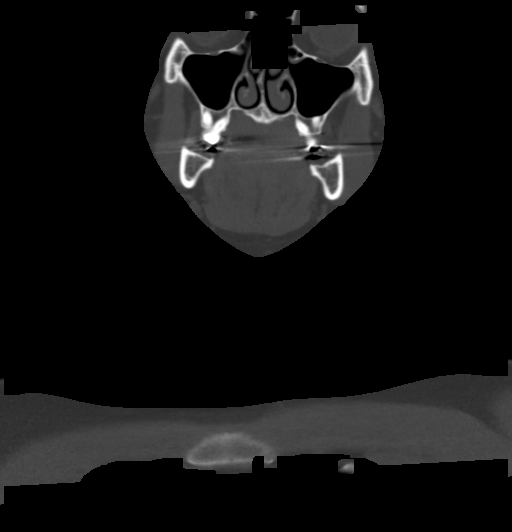
[im 57/133  bone]
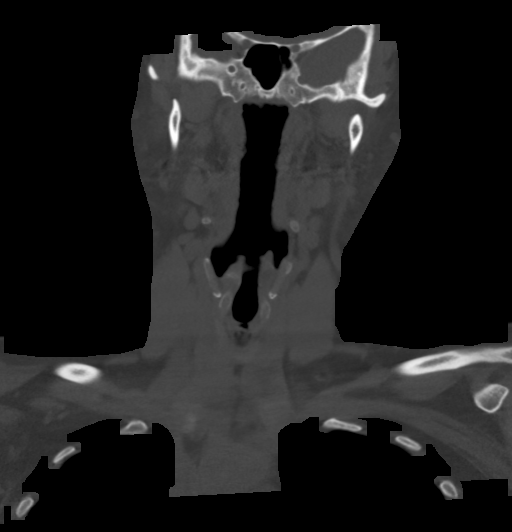
[im 76/133  bone]
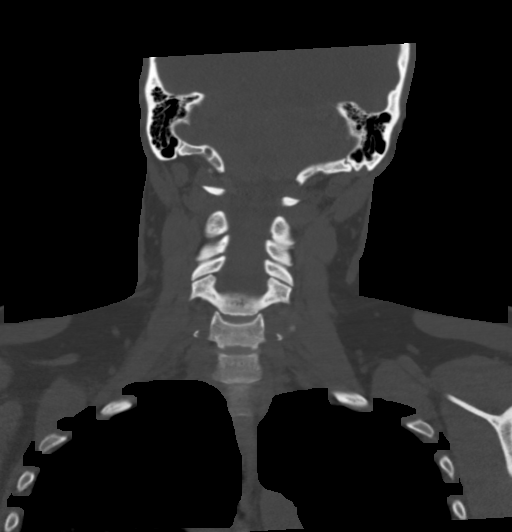

[Series 7: sagittal neck · sagittal · 0.45mm/px · 5 of 101 slices shown, 6 images]
[im 34/101  bone]
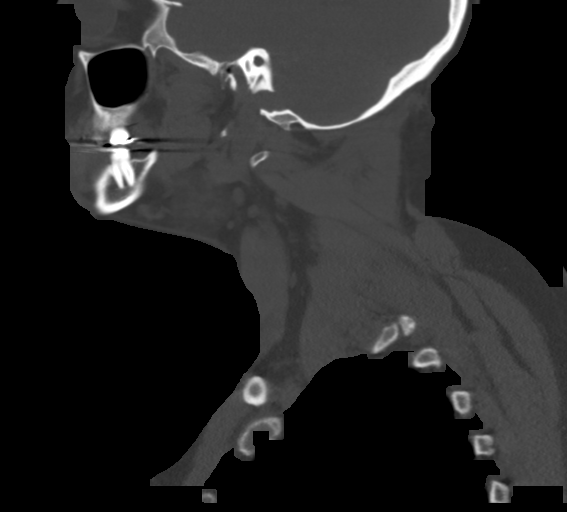
[im 42/101  bone]
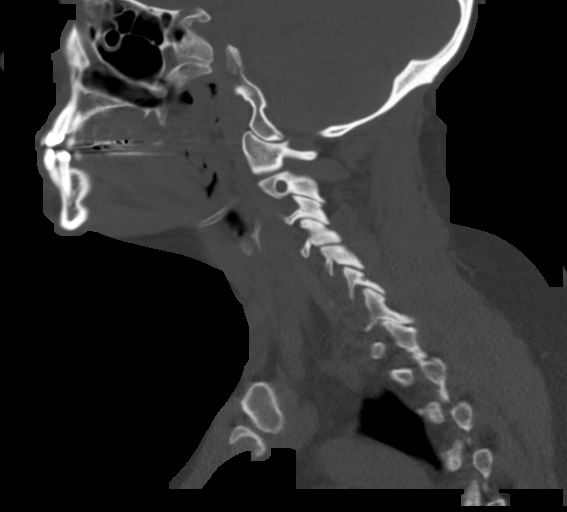
[im 51/101  soft-tissue]
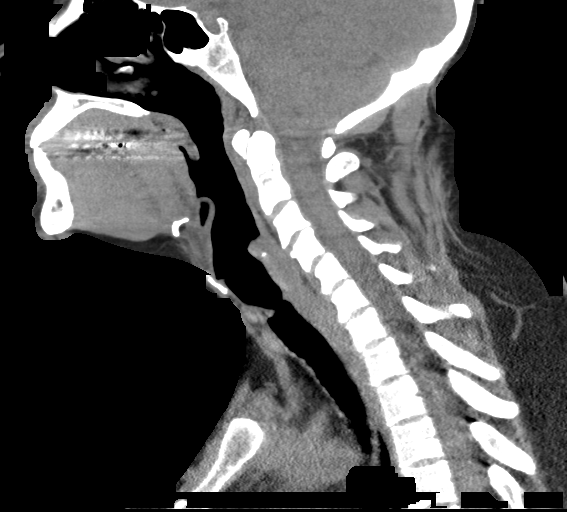
[im 51/101  bone]
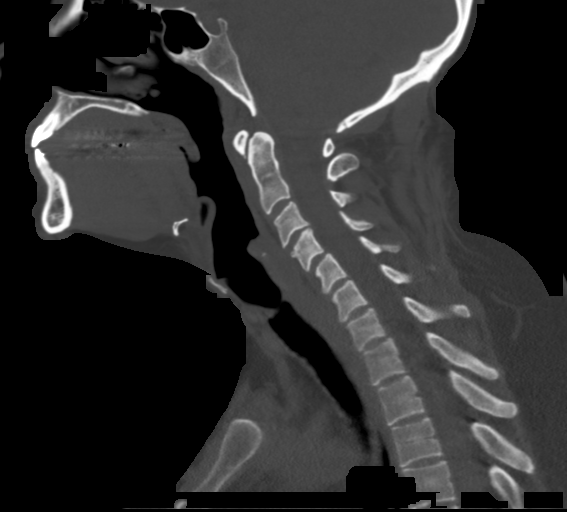
[im 59/101  bone]
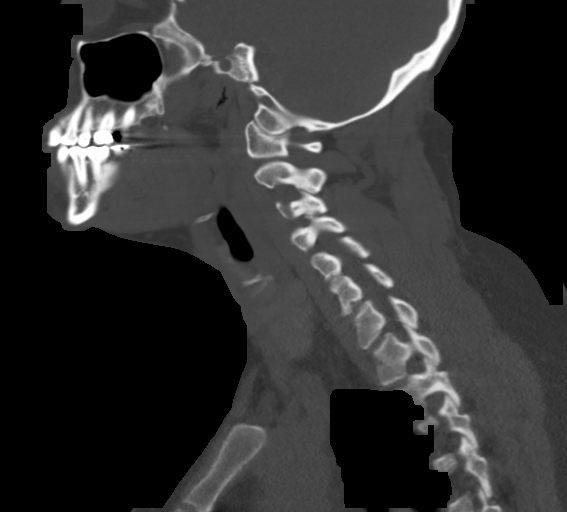
[im 67/101  bone]
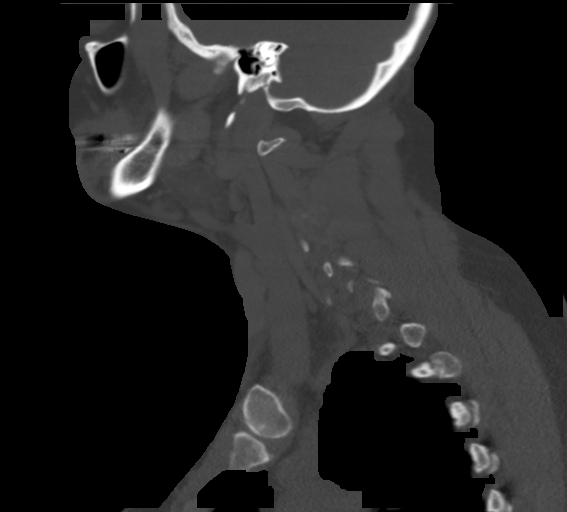

[Series 8: orthogonal ax · axial · 0.39mm/px · z∈[-27,+156]mm · 4 of 135 slices shown, 5 images]
[im 20/135  soft-tissue]
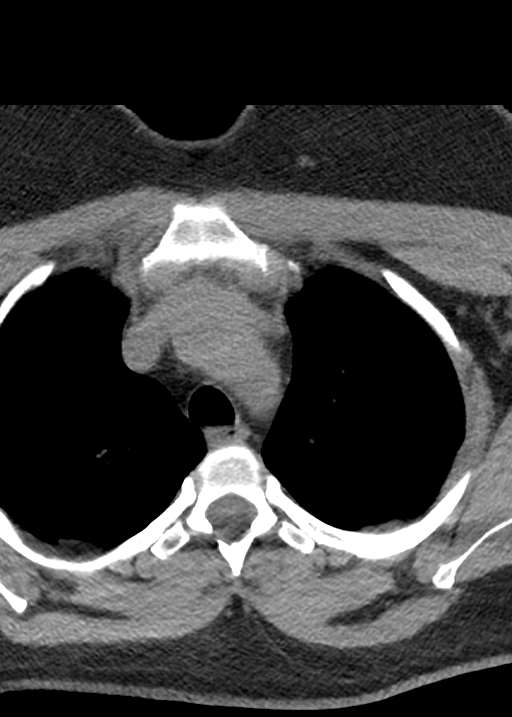
[im 20/135  bone]
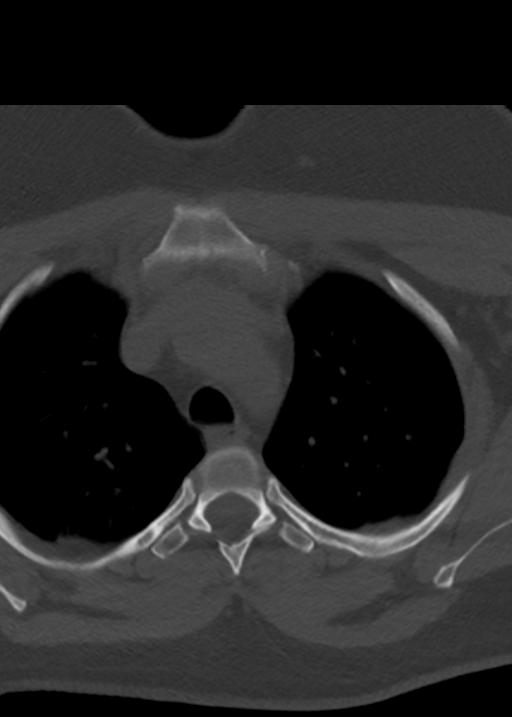
[im 58/135  bone]
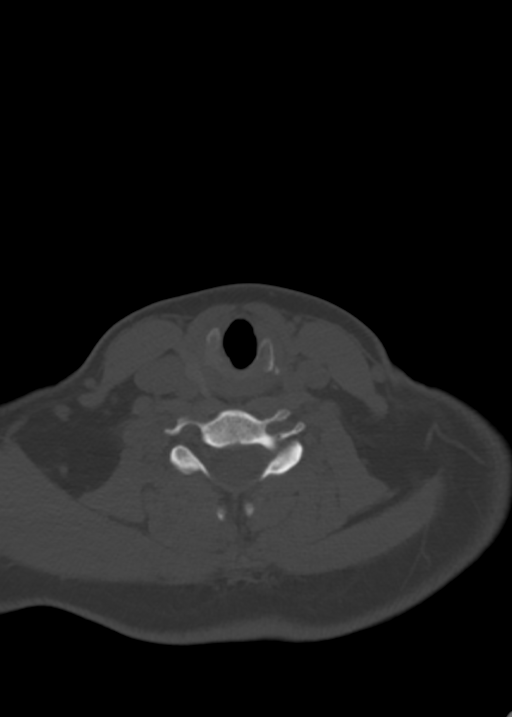
[im 77/135  bone]
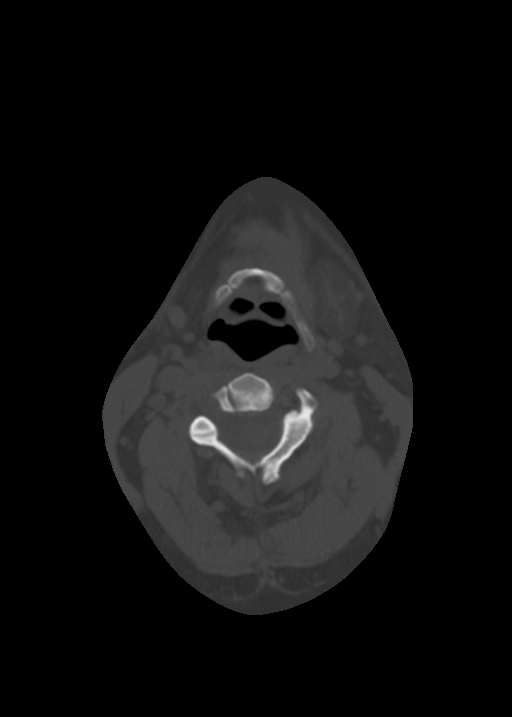
[im 115/135  bone]
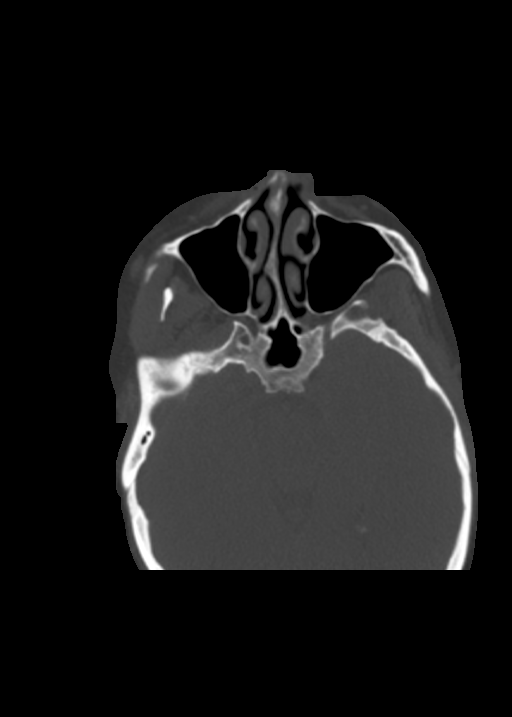

[12 of 35 positions shown; findings below may reference images not displayed]

FINDINGS: Pharynx and larynx: In the subglottic trachea there is abnormal soft
tissue thickening internal to the cricoid ring (series 2, images
70-72) which is nearly circumferential and slightly greater on the
right. Much smoother abnormal soft tissue thickening was
demonstrated here in 6599. subsequent tracheal stenosis is only mild
(series 2, image 71). And caudal to this level the trachea appears
normal to the carina.

Negative larynx contours. Pharyngeal soft tissue contours are
normal. Negative parapharyngeal and retropharyngeal spaces.

Salivary glands: Negative sublingual space. Noncontrast
submandibular and parotid glands appear symmetric and within normal
limits.

Thyroid: Negative.

Lymph nodes: Negative.  No cervical lymphadenopathy.

Vascular: Vascular patency is not evaluated in the absence of IV
contrast. No calcified atherosclerosis in the neck or at the skull
base.

Limited intracranial: Negative.

Visualized orbits: Negative.

Mastoids and visualized paranasal sinuses: Visualized paranasal
sinuses and mastoids are stable and well pneumatized.

Skeleton: Negative.

Upper chest: Abnormal subglottic trachea described above. Negative
visible upper lungs and noncontrast mediastinum.
IMPRESSION: 1. Lobulated soft tissue in the subglottic trachea internal to the
cricoid ring is abnormal but nonspecific. And though chronic to a
degree, has substantially increased since 6599.
Direct visualization and biopsy likely would be most valuable. Top
differential considerations include papillomatosis and
postinflammatory granulation.
2. Otherwise negative non-contrast neck CT.

## 2020-02-28 ENCOUNTER — Other Ambulatory Visit: Payer: Self-pay

## 2020-02-28 ENCOUNTER — Encounter: Payer: Self-pay | Admitting: Cardiology

## 2020-02-28 ENCOUNTER — Ambulatory Visit: Payer: 59 | Admitting: Cardiology

## 2020-02-28 ENCOUNTER — Encounter: Payer: Self-pay | Admitting: *Deleted

## 2020-02-28 VITALS — BP 130/72 | HR 76 | Ht 67.5 in | Wt 204.0 lb

## 2020-02-28 DIAGNOSIS — Z8616 Personal history of COVID-19: Secondary | ICD-10-CM | POA: Diagnosis not present

## 2020-02-28 DIAGNOSIS — R9431 Abnormal electrocardiogram [ECG] [EKG]: Secondary | ICD-10-CM | POA: Diagnosis not present

## 2020-02-28 NOTE — Progress Notes (Signed)
Clinical Summary Laurie Thompson is a 45 y.o.female  1. Abnormal EKG - from ER visit Jan 2022 at [facility] with COVID, from reports EKG QTc was 503. Just have report not actual tracing.  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    3. COVID + - tested positive Jan 30 2020 ER visit  4. Vertigo - she is on meclizine.   SH: works as [employer]    Past Medical History:  Diagnosis Date  . Abnormal uterine bleeding (AUB) 05/22/2013  . Fecal occult blood test positive 01/29/2015  . Mitral valve prolapse    had echo 20 yrs ago no meds no prophylatic antibiotics  . Persistent headaches   . PONV (postoperative nausea and vomiting)   . Stress 01/29/2015  . Subglottic stenosis    mass     Allergies  Allergen Reactions  . Dihydrocodeine Anaphylaxis and Itching  . Dilaudid [Hydromorphone Hcl] Anaphylaxis    Throat sweling  . Erythromycin Other (See Comments)    Severe GI side effects  . Hibiclens [Chlorhexidine Gluconate] Rash     Current Outpatient Medications  Medication Sig Dispense Refill  . citalopram (CELEXA) 20 MG tablet Take 20 mg by mouth at bedtime.    . meclizine (ANTIVERT) 12.5 MG tablet Take 12.5 mg by mouth at bedtime.     No current facility-administered medications for this visit.     Past Surgical History:  Procedure Laterality Date  . BREAST REDUCTION SURGERY  10/06/2011   Procedure: MAMMARY REDUCTION  (BREAST);  Surgeon: Crissie Reese, MD;  Location: [facility];  Service: Plastics;  Laterality: Bilateral;  . CHOLECYSTECTOMY  02/15/2006   lap. chole.  Marland Kitchen DILATION AND CURETTAGE OF UTERUS    . DILITATION & CURRETTAGE/HYSTROSCOPY WITH THERMACHOICE ABLATION N/A 06/07/2013   Procedure: DILATATION & CURETTAGE/HYSTEROSCOPY  WITH THERMACHOICE ABLATION;  Surgeon: [name], MD;  Location: [facility];  Service: Gynecology;  Laterality: N/A;  total time 12 minutes; 68-88 degrees celcius; 69ml of D5W in and 11ml of D5W out  . FOOT SURGERY  02/27/2009   percutaneous microfasciotomy left foot  . GASTROCNEMIUS RECESSION  05/01/2009   left gastroc. slide  . LARYNGOSCOPY AND BRONCHOSCOPY N/A 10/26/2018   Procedure: LARYNGOSCOPY WITH BIOPSY AND BRONCHOSCOPY;  Surgeon: Leta Baptist, MD;  Location: [facility];  Service: ENT;  Laterality: N/A;  . PLANTAR FASCIA RELEASE  02-2009  . REDUCTION MAMMAPLASTY    . WISDOM TOOTH EXTRACTION       Allergies  Allergen Reactions  . Dihydrocodeine Anaphylaxis and Itching  . Dilaudid [Hydromorphone Hcl] Anaphylaxis    Throat sweling  . Erythromycin Other (See Comments)    Severe GI side effects  . Hibiclens [Chlorhexidine Gluconate] Rash      Family History  Problem Relation Age of Onset  . Hyperlipidemia Mother   . Macular degeneration Mother   . Cancer Father        lung cancer  . COPD Father   . Lung disease Father   . Heart disease Maternal Grandmother   . Lung disease Maternal Grandmother   . Hypertension Sister   . Fibromyalgia Sister   . GER disease Brother   . Cancer Paternal Aunt  cervical or ovarian   . Heart disease Maternal Grandfather      Social History Laurie Thompson reports that she has never smoked. She has never used smokeless tobacco. Laurie Thompson reports no history of alcohol use.   Review of Systems CONSTITUTIONAL: No weight loss, fever, chills, weakness or fatigue.  HEENT: Eyes: No visual loss, blurred vision, double vision or yellow sclerae.No hearing loss, sneezing, congestion, runny nose or sore throat.  SKIN: No rash or itching.  CARDIOVASCULAR: per hpi RESPIRATORY: No shortness of breath, cough or sputum.  GASTROINTESTINAL: No anorexia, nausea, vomiting or diarrhea. No abdominal pain or blood.  GENITOURINARY: No burning on urination, no  polyuria NEUROLOGICAL: No headache, dizziness, syncope, paralysis, ataxia, numbness or tingling in the extremities. No change in bowel or bladder control.  MUSCULOSKELETAL: No muscle, back pain, joint pain or stiffness.  LYMPHATICS: No enlarged nodes. No history of splenectomy.  PSYCHIATRIC: No history of depression or anxiety.  ENDOCRINOLOGIC: No reports of sweating, cold or heat intolerance. No polyuria or polydipsia.  Marland Kitchen   Physical Examination Today's Vitals   02/28/20 1358  BP: 130/72  Pulse: 76  SpO2: 95%  Weight: 204 lb (92.5 kg)  Height: 5' 7.5" (1.715 m)   Body mass index is 31.48 kg/m.  Gen: resting comfortably, no acute distress HEENT: no scleral icterus, pupils equal round and reactive, no palptable cervical adenopathy,  CV: RRR, no m/r/g no jvd Resp: Clear to auscultation bilaterally GI: abdomen is soft, non-tender, non-distended, normal bowel sounds, no hepatosplenomegaly MSK: extremities are warm, no edema.  Skin: warm, no rash Neuro:  no focal deficits Psych: appropriate affect   Diagnostic Studies  Jan 2020 echo Study Conclusions   - Left ventricle: The cavity size was normal. Systolic function was  normal. The estimated ejection fraction was in the range of 60%  to 65%. Wall motion was normal; there were no regional wall  motion abnormalities. Left ventricular diastolic function  parameters were normal.  - Atrial septum: No defect or patent foramen ovale was identified.     Assessment and Plan  1. Abnormal EKG - reported prolonged QTc at EKG done at [facility], I do not have the tracing - EKG today shows SR, manual QTc 440 - will request actual EKG from [facility], I would most likely suspect a computer measurement error which is fairly common with the QTc. If truly was prolonged would need to consider coming off celexa as this can prolong QTc.       Arnoldo Lenis, M.D.

## 2020-02-28 NOTE — Patient Instructions (Signed)
Medication Instructions:  Your physician recommends that you continue on your current medications as directed. Please refer to the Current Medication list given to you today.  *If you need a refill on your cardiac medications before your next appointment, please call your pharmacy*   Lab Work: NONE   If you have labs (blood work) drawn today and your tests are completely normal, you will receive your results only by: Marland Kitchen MyChart Message (if you have MyChart) OR . A paper copy in the mail If you have any lab test that is abnormal or we need to change your treatment, we will call you to review the results.   Testing/Procedures: NONE    Follow-Up: At [facility], you and your health needs are our priority.  As part of our continuing mission to provide you with exceptional heart care, we have created designated Provider Care Teams.  These Care Teams include your primary Cardiologist (physician) and Advanced Practice Providers (APPs -  Physician Assistants and Nurse Practitioners) who all work together to provide you with the care you need, when you need it.  We recommend signing up for the patient portal called "MyChart".  Sign up information is provided on this After Visit Summary.  MyChart is used to connect with patients for Virtual Visits (Telemedicine).  Patients are able to view lab/test results, encounter notes, upcoming appointments, etc.  Non-urgent messages can be sent to your provider as well.   To learn more about what you can do with MyChart, go to [email or web address].    Your next appointment:    As Needed   The format for your next appointment:   In Person  Provider:   Carlyle Dolly, MD   Other Instructions Thank you for choosing [facility]!

## 2020-05-09 ENCOUNTER — Other Ambulatory Visit: Payer: Self-pay | Admitting: Obstetrics and Gynecology

## 2020-05-09 DIAGNOSIS — Z1231 Encounter for screening mammogram for malignant neoplasm of breast: Secondary | ICD-10-CM

## 2020-06-28 ENCOUNTER — Other Ambulatory Visit: Payer: Self-pay

## 2020-06-28 ENCOUNTER — Ambulatory Visit
Admission: RE | Admit: 2020-06-28 | Discharge: 2020-06-28 | Disposition: A | Discharge status: Home / Self Care | Payer: 59 | Source: Ambulatory Visit | Attending: Obstetrics and Gynecology | Admitting: Obstetrics and Gynecology

## 2020-06-28 DIAGNOSIS — Z1231 Encounter for screening mammogram for malignant neoplasm of breast: Secondary | ICD-10-CM

## 2021-09-30 ENCOUNTER — Other Ambulatory Visit: Payer: Self-pay | Admitting: Obstetrics and Gynecology

## 2021-09-30 DIAGNOSIS — Z1231 Encounter for screening mammogram for malignant neoplasm of breast: Secondary | ICD-10-CM

## 2021-10-15 ENCOUNTER — Ambulatory Visit
Admission: RE | Admit: 2021-10-15 | Discharge: 2021-10-15 | Disposition: A | Discharge status: Home / Self Care | Payer: BC Managed Care – PPO | Source: Ambulatory Visit | Attending: Obstetrics and Gynecology | Admitting: Obstetrics and Gynecology

## 2021-10-15 DIAGNOSIS — Z1231 Encounter for screening mammogram for malignant neoplasm of breast: Secondary | ICD-10-CM

## 2021-10-17 ENCOUNTER — Other Ambulatory Visit: Payer: Self-pay | Admitting: Obstetrics and Gynecology

## 2021-10-17 DIAGNOSIS — R928 Other abnormal and inconclusive findings on diagnostic imaging of breast: Secondary | ICD-10-CM

## 2021-10-23 ENCOUNTER — Ambulatory Visit
Admission: RE | Admit: 2021-10-23 | Discharge: 2021-10-23 | Disposition: A | Discharge status: Home / Self Care | Payer: BC Managed Care – PPO | Source: Ambulatory Visit | Attending: Obstetrics and Gynecology | Admitting: Obstetrics and Gynecology

## 2021-10-23 ENCOUNTER — Other Ambulatory Visit: Payer: Self-pay | Admitting: Obstetrics and Gynecology

## 2021-10-23 DIAGNOSIS — R928 Other abnormal and inconclusive findings on diagnostic imaging of breast: Secondary | ICD-10-CM

## 2021-10-23 DIAGNOSIS — N6310 Unspecified lump in the right breast, unspecified quadrant: Secondary | ICD-10-CM

## 2021-11-07 ENCOUNTER — Ambulatory Visit
Admission: RE | Admit: 2021-11-07 | Discharge: 2021-11-07 | Disposition: A | Discharge status: Home / Self Care | Payer: BC Managed Care – PPO | Source: Ambulatory Visit | Attending: Obstetrics and Gynecology | Admitting: Obstetrics and Gynecology

## 2021-11-07 DIAGNOSIS — N6310 Unspecified lump in the right breast, unspecified quadrant: Secondary | ICD-10-CM

## 2021-11-07 HISTORY — PX: BREAST BIOPSY: SHX20

## 2022-04-22 ENCOUNTER — Other Ambulatory Visit: Payer: Self-pay | Admitting: Obstetrics and Gynecology

## 2022-04-22 DIAGNOSIS — N63 Unspecified lump in unspecified breast: Secondary | ICD-10-CM

## 2022-06-29 ENCOUNTER — Ambulatory Visit
Admission: RE | Admit: 2022-06-29 | Discharge: 2022-06-29 | Disposition: A | Discharge status: Home / Self Care | Payer: BC Managed Care – PPO | Source: Ambulatory Visit | Attending: Obstetrics and Gynecology | Admitting: Obstetrics and Gynecology

## 2022-06-29 DIAGNOSIS — N63 Unspecified lump in unspecified breast: Secondary | ICD-10-CM

## 2022-09-09 ENCOUNTER — Other Ambulatory Visit: Payer: Self-pay | Admitting: Obstetrics and Gynecology

## 2022-09-09 DIAGNOSIS — Z1231 Encounter for screening mammogram for malignant neoplasm of breast: Secondary | ICD-10-CM

## 2022-10-22 ENCOUNTER — Ambulatory Visit
Admission: RE | Admit: 2022-10-22 | Discharge: 2022-10-22 | Disposition: A | Discharge status: Home / Self Care | Payer: BC Managed Care – PPO | Source: Ambulatory Visit | Attending: Obstetrics and Gynecology | Admitting: Obstetrics and Gynecology

## 2022-10-22 DIAGNOSIS — Z1231 Encounter for screening mammogram for malignant neoplasm of breast: Secondary | ICD-10-CM

## 2022-10-27 ENCOUNTER — Other Ambulatory Visit: Payer: Self-pay | Admitting: Obstetrics and Gynecology

## 2022-10-27 DIAGNOSIS — R928 Other abnormal and inconclusive findings on diagnostic imaging of breast: Secondary | ICD-10-CM

## 2022-11-06 ENCOUNTER — Ambulatory Visit
Admission: RE | Admit: 2022-11-06 | Discharge: 2022-11-06 | Disposition: A | Discharge status: Home / Self Care | Payer: BC Managed Care – PPO | Source: Ambulatory Visit | Attending: Obstetrics and Gynecology | Admitting: Obstetrics and Gynecology

## 2022-11-06 DIAGNOSIS — R928 Other abnormal and inconclusive findings on diagnostic imaging of breast: Secondary | ICD-10-CM

## 2023-05-18 ENCOUNTER — Encounter: Payer: Self-pay | Admitting: *Deleted

## 2023-10-12 ENCOUNTER — Other Ambulatory Visit: Payer: Self-pay | Admitting: Obstetrics and Gynecology

## 2023-10-12 DIAGNOSIS — Z0000 Encounter for general adult medical examination without abnormal findings: Secondary | ICD-10-CM

## 2023-11-16 ENCOUNTER — Ambulatory Visit
Admission: RE | Admit: 2023-11-16 | Discharge: 2023-11-16 | Disposition: A | Discharge status: Home / Self Care | Source: Ambulatory Visit | Attending: Obstetrics and Gynecology | Admitting: Obstetrics and Gynecology

## 2023-11-16 DIAGNOSIS — Z0000 Encounter for general adult medical examination without abnormal findings: Secondary | ICD-10-CM

## 2023-11-17 ENCOUNTER — Encounter (INDEPENDENT_AMBULATORY_CARE_PROVIDER_SITE_OTHER): Payer: Self-pay | Admitting: *Deleted
